# Patient Record
Sex: Female | Born: 1950 | Race: White | Hispanic: No | Smoking: Never smoker
Health system: Southern US, Community
[De-identification: ages and names within clinical notes are randomized; demographics above are authoritative.]

## PROBLEM LIST (undated history)

## (undated) DIAGNOSIS — E079 Disorder of thyroid, unspecified: Secondary | ICD-10-CM

## (undated) DIAGNOSIS — I493 Ventricular premature depolarization: Secondary | ICD-10-CM

## (undated) DIAGNOSIS — T7840XA Allergy, unspecified, initial encounter: Secondary | ICD-10-CM

## (undated) DIAGNOSIS — H269 Unspecified cataract: Secondary | ICD-10-CM

## (undated) DIAGNOSIS — E041 Nontoxic single thyroid nodule: Secondary | ICD-10-CM

## (undated) HISTORY — DX: Allergy, unspecified, initial encounter: T78.40XA

## (undated) HISTORY — DX: Unspecified cataract: H26.9

## (undated) HISTORY — DX: Nontoxic single thyroid nodule: E04.1

## (undated) HISTORY — PX: BREAST EXCISIONAL BIOPSY: SUR124

## (undated) HISTORY — PX: BIOPSY BREAST: PRO8

## (undated) HISTORY — DX: Disorder of thyroid, unspecified: E07.9

## (undated) HISTORY — PX: TONSILLECTOMY AND ADENOIDECTOMY: SHX28

## (undated) HISTORY — PX: CATARACT EXTRACTION: SUR2

## (undated) HISTORY — DX: Ventricular premature depolarization: I49.3

---

## 2020-07-23 ENCOUNTER — Other Ambulatory Visit: Payer: Self-pay

## 2020-07-26 ENCOUNTER — Other Ambulatory Visit: Payer: Self-pay

## 2020-07-26 ENCOUNTER — Encounter: Payer: Self-pay | Admitting: Family Medicine

## 2020-07-26 ENCOUNTER — Ambulatory Visit (INDEPENDENT_AMBULATORY_CARE_PROVIDER_SITE_OTHER): Payer: Medicare Other | Admitting: Family Medicine

## 2020-07-26 VITALS — BP 118/68 | HR 97 | Temp 97.4°F | Ht 65.0 in | Wt 130.4 lb

## 2020-07-26 DIAGNOSIS — Z1231 Encounter for screening mammogram for malignant neoplasm of breast: Secondary | ICD-10-CM

## 2020-07-26 DIAGNOSIS — H2513 Age-related nuclear cataract, bilateral: Secondary | ICD-10-CM

## 2020-07-26 DIAGNOSIS — Z0000 Encounter for general adult medical examination without abnormal findings: Secondary | ICD-10-CM

## 2020-07-26 DIAGNOSIS — Z114 Encounter for screening for human immunodeficiency virus [HIV]: Secondary | ICD-10-CM | POA: Insufficient documentation

## 2020-07-26 DIAGNOSIS — E2839 Other primary ovarian failure: Secondary | ICD-10-CM | POA: Diagnosis not present

## 2020-07-26 DIAGNOSIS — R7309 Other abnormal glucose: Secondary | ICD-10-CM

## 2020-07-26 DIAGNOSIS — M8580 Other specified disorders of bone density and structure, unspecified site: Secondary | ICD-10-CM | POA: Insufficient documentation

## 2020-07-26 NOTE — Patient Instructions (Signed)
Health Maintenance After Age 71 After age 71, you are at a higher risk for certain long-term diseases and infections as well as injuries from falls. Falls are a major cause of broken bones and head injuries in people who are older than age 71. Getting regular preventive care can help to keep you healthy and well. Preventive care includes getting regular testing and making lifestyle changes as recommended by your health care provider. Talk with your health care provider about:  Which screenings and tests you should have. A screening is a test that checks for a disease when you have no symptoms.  A diet and exercise plan that is right for you. What should I know about screenings and tests to prevent falls? Screening and testing are the best ways to find a health problem early. Early diagnosis and treatment give you the best chance of managing medical conditions that are common after age 71. Certain conditions and lifestyle choices may make you more likely to have a fall. Your health care provider may recommend:  Regular vision checks. Poor vision and conditions such as cataracts can make you more likely to have a fall. If you wear glasses, make sure to get your prescription updated if your vision changes.  Medicine review. Work with your health care provider to regularly review all of the medicines you are taking, including over-the-counter medicines. Ask your health care provider about any side effects that may make you more likely to have a fall. Tell your health care provider if any medicines that you take make you feel dizzy or sleepy.  Osteoporosis screening. Osteoporosis is a condition that causes the bones to get weaker. This can make the bones weak and cause them to break more easily.  Blood pressure screening. Blood pressure changes and medicines to control blood pressure can make you feel dizzy.  Strength and balance checks. Your health care provider may recommend certain tests to check your  strength and balance while standing, walking, or changing positions.  Foot health exam. Foot pain and numbness, as well as not wearing proper footwear, can make you more likely to have a fall.  Depression screening. You may be more likely to have a fall if you have a fear of falling, feel emotionally low, or feel unable to do activities that you used to do.  Alcohol use screening. Using too much alcohol can affect your balance and may make you more likely to have a fall. What actions can I take to lower my risk of falls? General instructions  Talk with your health care provider about your risks for falling. Tell your health care provider if: ? You fall. Be sure to tell your health care provider about all falls, even ones that seem minor. ? You feel dizzy, sleepy, or off-balance.  Take over-the-counter and prescription medicines only as told by your health care provider. These include any supplements.  Eat a healthy diet and maintain a healthy weight. A healthy diet includes low-fat dairy products, low-fat (lean) meats, and fiber from whole grains, beans, and lots of fruits and vegetables. Home safety  Remove any tripping hazards, such as rugs, cords, and clutter.  Install safety equipment such as grab bars in bathrooms and safety rails on stairs.  Keep rooms and walkways well-lit. Activity  Follow a regular exercise program to stay fit. This will help you maintain your balance. Ask your health care provider what types of exercise are appropriate for you.  If you need a cane or walker,   use it as recommended by your health care provider.  Wear supportive shoes that have nonskid soles.   Lifestyle  Do not drink alcohol if your health care provider tells you not to drink.  If you drink alcohol, limit how much you have: ? 0-1 drink a day for women. ? 0-2 drinks a day for men.  Be aware of how much alcohol is in your drink. In the [place] one drink equals one typical bottle of beer (12  oz), one-half glass of wine (5 oz), or one shot of hard liquor (1 oz).  Do not use any products that contain nicotine or tobacco, such as cigarettes and e-cigarettes. If you need help quitting, ask your health care provider. Summary  Having a healthy lifestyle and getting preventive care can help to protect your health and wellness after age 74.  Screening and testing are the best way to find a health problem early and help you avoid having a fall. Early diagnosis and treatment give you the best chance for managing medical conditions that are more common for people who are older than age 48.  Falls are a major cause of broken bones and head injuries in people who are older than age 5. Take precautions to prevent a fall at home.  Work with your health care provider to learn what changes you can make to improve your health and wellness and to prevent falls. This information is not intended to replace advice given to you by your health care provider. Make sure you discuss any questions you have with your health care provider. Document Revised: 07/04/2018 Document Reviewed: 01/24/2017 Elsevier Patient Education  [place] 65 Years and Older, Female Preventive care refers to lifestyle choices and visits with your health care provider that can promote health and wellness. This includes:  A yearly physical exam. This is also called an annual wellness visit.  Regular dental and eye exams.  Immunizations.  Screening for certain conditions.  Healthy lifestyle choices, such as: ? Eating a healthy diet. ? Getting regular exercise. ? Not using drugs or products that contain nicotine and tobacco. ? Limiting alcohol use. What can I expect for my preventive care visit? Physical exam Your health care provider will check your:  Height and weight. These may be used to calculate your BMI (body mass index). BMI is a measurement that tells if you are at a healthy  weight.  Heart rate and blood pressure.  Body temperature.  Skin for abnormal spots. Counseling Your health care provider may ask you questions about your:  Past medical problems.  Family's medical history.  Alcohol, tobacco, and drug use.  Emotional well-being.  Home life and relationship well-being.  Sexual activity.  Diet, exercise, and sleep habits.  History of falls.  Memory and ability to understand (cognition).  Work and work Statistician.  Pregnancy and menstrual history.  Access to firearms. What immunizations do I need? Vaccines are usually given at various ages, according to a schedule. Your health care provider will recommend vaccines for you based on your age, medical history, and lifestyle or other factors, such as travel or where you work.   What tests do I need? Blood tests  Lipid and cholesterol levels. These may be checked every 5 years, or more often depending on your overall health.  Hepatitis C test.  Hepatitis B test. Screening  Lung cancer screening. You may have this screening every year starting at age 6 if you have a 30-pack-year  history of smoking and currently smoke or have quit within the past 15 years.  Colorectal cancer screening. ? All adults should have this screening starting at age 26 and continuing until age 74. ? Your health care provider may recommend screening at age 56 if you are at increased risk. ? You will have tests every 1-10 years, depending on your results and the type of screening test.  Diabetes screening. ? This is done by checking your blood sugar (glucose) after you have not eaten for a while (fasting). ? You may have this done every 1-3 years.  Mammogram. ? This may be done every 1-2 years. ? Talk with your health care provider about how often you should have regular mammograms.  Abdominal aortic aneurysm (AAA) screening. You may need this if you are a current or former smoker.  BRCA-related cancer  screening. This may be done if you have a family history of breast, ovarian, tubal, or peritoneal cancers. Other tests  STD (sexually transmitted disease) testing, if you are at risk.  Bone density scan. This is done to screen for osteoporosis. You may have this done starting at age 67. Talk with your health care provider about your test results, treatment options, and if necessary, the need for more tests. Follow these instructions at home: Eating and drinking  Eat a diet that includes fresh fruits and vegetables, whole grains, lean protein, and low-fat dairy products. Limit your intake of foods with high amounts of sugar, saturated fats, and salt.  Take vitamin and mineral supplements as recommended by your health care provider.  Do not drink alcohol if your health care provider tells you not to drink.  If you drink alcohol: ? Limit how much you have to 0-1 drink a day. ? Be aware of how much alcohol is in your drink. In the [place] one drink equals one 12 oz bottle of beer (355 mL), one 5 oz glass of wine (148 mL), or one 1 oz glass of hard liquor (44 mL).   Lifestyle  Take daily care of your teeth and gums. Brush your teeth every morning and night with fluoride toothpaste. Floss one time each day.  Stay active. Exercise for at least 30 minutes 5 or more days each week.  Do not use any products that contain nicotine or tobacco, such as cigarettes, e-cigarettes, and chewing tobacco. If you need help quitting, ask your health care provider.  Do not use drugs.  If you are sexually active, practice safe sex. Use a condom or other form of protection in order to prevent STIs (sexually transmitted infections).  Talk with your health care provider about taking a low-dose aspirin or statin.  Find healthy ways to cope with stress, such as: ? Meditation, yoga, or listening to music. ? Journaling. ? Talking to a trusted person. ? Spending time with friends and family. Safety  Always  wear your seat belt while driving or riding in a vehicle.  Do not drive: ? If you have been drinking alcohol. Do not ride with someone who has been drinking. ? When you are tired or distracted. ? While texting.  Wear a helmet and other protective equipment during sports activities.  If you have firearms in your house, make sure you follow all gun safety procedures. What's next?  Visit your health care provider once a year for an annual wellness visit.  Ask your health care provider how often you should have your eyes and teeth checked.  Stay up to date  on all vaccines. This information is not intended to replace advice given to you by your health care provider. Make sure you discuss any questions you have with your health care provider. Document Revised: 03/03/2020 Document Reviewed: 03/07/2018 Elsevier Patient Education  2021 [employer].

## 2020-07-26 NOTE — Progress Notes (Signed)
New Patient Office Visit  Subjective:  Patient ID: Joanna Rowe, female    DOB: Apr 19, 1950  Age: 70 y.o. MRN: [id number]  CC:  Chief Complaint  Patient presents with  . Establish Care    NP/establish care would like referrals to eye doctor, mammogram and colonoscopy.     HPI Joanna Rowe presents for establishment of care and follow-up for health maintenance.  Moved into this area from [place] back in January to be closer to family.  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  Her late husband died 15 years ago.  She is healthy as far she knows.  She takes no regular medications.  History of osteopenia that has been successfully treated in the past.  Last Pap smear was back in September of this year.  She is due for mammogram, DEXA scan and colonoscopy.  She has cataracts and feels as though they need to be removed.  She has no children.  Her brother-in-law is also a patient of mine.  Past Medical History:  Diagnosis Date  . Allergy   . Cataract     Past Surgical History:  Procedure Laterality Date  . BIOPSY BREAST    . TONSILLECTOMY AND ADENOIDECTOMY      Family History  Problem Relation Age of Onset  . Diabetes Mother   . Heart disease Father     Social History   Socioeconomic History  . Marital status: Widowed    Spouse name: Not on file  . Number of children: Not on file  . Years of education: Not on file  . Highest education level: Not on file  Occupational History  . Not on file  Tobacco Use  . Smoking status: Never Smoker  . Smokeless tobacco: Never Used  Vaping Use  . Vaping Use: Never used  Substance and Sexual Activity  . Alcohol use: Yes    Comment: 1 glass a week   . Drug use: Never  . Sexual activity: Not on file  Other Topics Concern  . Not on file  Social History Narrative  . Not on file   Social Determinants of Health   Financial Resource Strain: Not on file  Food Insecurity: Not on file  Transportation Needs: Not on file   Physical Activity: Not on file  Stress: Not on file  Social Connections: Not on file  Intimate Partner Violence: Not on file    ROS Review of Systems  Constitutional: Negative.   HENT: Positive for hearing loss.   Eyes: Positive for visual disturbance. Negative for photophobia.  Respiratory: Negative.   Cardiovascular: Negative.   Gastrointestinal: Negative.   Genitourinary: Negative.   Musculoskeletal: Negative.   Neurological: Negative.   Psychiatric/Behavioral: Negative.     Objective:   Today's Vitals: BP 118/68   Pulse 97   Temp (!) 97.4 F (36.3 C) (Temporal)   Ht 5\' 5"  (1.651 m)   Wt 130 lb 6.4 oz (59.1 kg)   SpO2 97%   BMI 21.70 kg/m   Physical Exam Vitals and nursing note reviewed.  Constitutional:      General: She is not in acute distress.    Appearance: Normal appearance. She is normal weight. She is not ill-appearing or toxic-appearing.  HENT:     Head: Normocephalic and atraumatic.     Right Ear: Tympanic membrane, ear canal and external ear normal.     Left Ear: Tympanic membrane, ear canal and external ear normal.     Mouth/Throat:  Mouth: Mucous membranes are moist.     Pharynx: Oropharynx is clear.  Eyes:     Conjunctiva/sclera: Conjunctivae normal.     Pupils: Pupils are equal, round, and reactive to light.  Neck:     Vascular: No carotid bruit.  Cardiovascular:     Rate and Rhythm: Normal rate and regular rhythm.  Pulmonary:     Effort: Pulmonary effort is normal.     Breath sounds: Normal breath sounds.  Abdominal:     General: Bowel sounds are normal.  Musculoskeletal:     Cervical back: No rigidity or tenderness.     Right lower leg: No edema.     Left lower leg: No edema.  Lymphadenopathy:     Cervical: No cervical adenopathy.  Skin:    General: Skin is warm and dry.  Neurological:     Mental Status: She is alert and oriented to person, place, and time.  Psychiatric:        Mood and Affect: Mood normal.     Assessment  & Plan:   Problem List Items Addressed This Visit      Musculoskeletal and Integument   Osteopenia     Other   Elevated glucose   Relevant Orders   Hemoglobin A1c   CBC   Nuclear age-related cataract, both eyes   Relevant Orders   Ambulatory referral to Ophthalmology   Healthcare maintenance - Primary   Relevant Orders   Ambulatory referral to Gastroenterology   Lipid panel   Urinalysis, Routine w reflex microscopic   MM Digital Screening   Comprehensive metabolic panel    Other Visit Diagnoses    Estrogen deficiency       Relevant Orders   DG Bone Density   Encounter for screening mammogram for malignant neoplasm of breast        Relevant Orders   MM Digital Screening      Outpatient Encounter Medications as of 07/26/2020  Medication Sig  . calcium citrate-vitamin D (CITRACAL+D) 315-200 MG-UNIT tablet Take 1 tablet by mouth 2 (two) times daily.  . cholecalciferol (VITAMIN D3) 25 MCG (1000 UNIT) tablet Take 1,000 Units by mouth daily.  . Multiple Vitamin (MULTIVITAMIN) capsule Take 1 capsule by mouth daily.   No facility-administered encounter medications on file as of 07/26/2020.    Follow-up: Return in about 1 year (around 07/26/2021).   Joanna Sax, MD

## 2020-08-02 ENCOUNTER — Ambulatory Visit (HOSPITAL_BASED_OUTPATIENT_CLINIC_OR_DEPARTMENT_OTHER)
Admission: RE | Admit: 2020-08-02 | Discharge: 2020-08-02 | Disposition: A | Discharge status: Home / Self Care | Payer: Medicare Other | Source: Ambulatory Visit | Attending: Family Medicine | Admitting: Family Medicine

## 2020-08-02 ENCOUNTER — Other Ambulatory Visit: Payer: Self-pay

## 2020-08-02 ENCOUNTER — Encounter (HOSPITAL_BASED_OUTPATIENT_CLINIC_OR_DEPARTMENT_OTHER): Payer: Self-pay

## 2020-08-02 ENCOUNTER — Other Ambulatory Visit: Payer: Self-pay | Admitting: Family Medicine

## 2020-08-02 DIAGNOSIS — E2839 Other primary ovarian failure: Secondary | ICD-10-CM | POA: Diagnosis present

## 2020-08-02 DIAGNOSIS — Z0000 Encounter for general adult medical examination without abnormal findings: Secondary | ICD-10-CM

## 2020-08-02 DIAGNOSIS — Z1231 Encounter for screening mammogram for malignant neoplasm of breast: Secondary | ICD-10-CM

## 2020-08-03 ENCOUNTER — Encounter: Payer: Self-pay | Admitting: Family Medicine

## 2020-08-04 ENCOUNTER — Encounter: Payer: Self-pay | Admitting: Family Medicine

## 2020-08-06 ENCOUNTER — Encounter: Payer: Self-pay | Admitting: Family Medicine

## 2020-08-06 ENCOUNTER — Other Ambulatory Visit: Payer: Self-pay

## 2020-08-06 ENCOUNTER — Telehealth: Payer: Self-pay | Admitting: Family Medicine

## 2020-08-06 ENCOUNTER — Other Ambulatory Visit (INDEPENDENT_AMBULATORY_CARE_PROVIDER_SITE_OTHER): Payer: Medicare Other

## 2020-08-06 DIAGNOSIS — Z0000 Encounter for general adult medical examination without abnormal findings: Secondary | ICD-10-CM | POA: Diagnosis not present

## 2020-08-06 DIAGNOSIS — R7309 Other abnormal glucose: Secondary | ICD-10-CM | POA: Diagnosis not present

## 2020-08-06 LAB — COMPREHENSIVE METABOLIC PANEL
ALT: 21 U/L (ref 0–35)
AST: 22 U/L (ref 0–37)
Albumin: 4.3 g/dL (ref 3.5–5.2)
Alkaline Phosphatase: 53 U/L (ref 39–117)
BUN: 23 mg/dL (ref 6–23)
CO2: 29 mEq/L (ref 19–32)
Calcium: 9.3 mg/dL (ref 8.4–10.5)
Chloride: 105 mEq/L (ref 96–112)
Creatinine, Ser: 0.71 mg/dL (ref 0.40–1.20)
GFR: 85.91 mL/min (ref >60.00)
Glucose, Bld: 99 mg/dL (ref 70–99)
Potassium: 4 mEq/L (ref 3.5–5.1)
Sodium: 141 mEq/L (ref 135–145)
Total Bilirubin: 0.5 mg/dL (ref 0.2–1.2)
Total Protein: 6.3 g/dL (ref 6.0–8.3)

## 2020-08-06 LAB — CBC
HCT: 40.8 % (ref 36.0–46.0)
Hemoglobin: 13.8 g/dL (ref 12.0–15.0)
MCHC: 33.9 g/dL (ref 30.0–36.0)
MCV: 95.1 fl (ref 78.0–100.0)
Platelets: 171 10*3/uL (ref 150.0–400.0)
RBC: 4.29 Mil/uL (ref 3.87–5.11)
RDW: 13.6 % (ref 11.5–15.5)
WBC: 3.7 10*3/uL — ABNORMAL LOW (ref 4.0–10.5)

## 2020-08-06 LAB — HEMOGLOBIN A1C: Hgb A1c MFr Bld: 5.8 % (ref 4.6–6.5)

## 2020-08-06 NOTE — Progress Notes (Signed)
Per orders of Dr. Kremer pt is here for labs pt tolerated draw well.  

## 2020-08-06 NOTE — Telephone Encounter (Signed)
Patient is calling regarding her Mammogram results. Please call her back at [phone] once they have been reviewed.

## 2020-08-10 ENCOUNTER — Encounter: Payer: Self-pay | Admitting: Family Medicine

## 2020-08-10 ENCOUNTER — Telehealth (INDEPENDENT_AMBULATORY_CARE_PROVIDER_SITE_OTHER): Payer: Medicare Other | Admitting: Family Medicine

## 2020-08-10 VITALS — Ht 65.0 in

## 2020-08-10 DIAGNOSIS — Z0000 Encounter for general adult medical examination without abnormal findings: Secondary | ICD-10-CM

## 2020-08-10 DIAGNOSIS — D709 Neutropenia, unspecified: Secondary | ICD-10-CM | POA: Diagnosis not present

## 2020-08-10 DIAGNOSIS — M816 Localized osteoporosis [Lequesne]: Secondary | ICD-10-CM | POA: Diagnosis not present

## 2020-08-10 DIAGNOSIS — M8080XA Other osteoporosis with current pathological fracture, unspecified site, initial encounter for fracture: Secondary | ICD-10-CM | POA: Insufficient documentation

## 2020-08-10 DIAGNOSIS — Z114 Encounter for screening for human immunodeficiency virus [HIV]: Secondary | ICD-10-CM

## 2020-08-10 NOTE — Progress Notes (Addendum)
Established Patient Office Visit  Subjective:  Patient ID: Joanna Rowe, female    DOB: 01-20-1951  Age: 70 y.o. MRN: [id number]  CC:  Chief Complaint  Patient presents with   Advice Only    Discuss bone density. Concerns about WBC on labs not sure if this would affect upcoming eye surgery.     HPI Joanna Rowe presents for follow-up of her DEXA scan and concern about neutropenia.  White cell count was 3.7.  Patient was osteoporotic in her left forearm.  Measurements taken in the femur neck and lumbar spine were normal.  She took Actonel for at least 5 years.  She continues taking 5000 units of vitamin D and over 1200 mg of calcium daily.  She has no risk factors for HIV.  Husband passed 15 years ago and she has had no exposures.  Past Medical History:  Diagnosis Date   Allergy    Cataract     Past Surgical History:  Procedure Laterality Date   BIOPSY BREAST     BREAST EXCISIONAL BIOPSY Left    TONSILLECTOMY AND ADENOIDECTOMY      Family History  Problem Relation Age of Onset   Diabetes Mother    Heart disease Father    Breast cancer Maternal Aunt     Social History   Socioeconomic History   Marital status: Widowed    Spouse name: Not on file   Number of children: Not on file   Years of education: Not on file   Highest education level: Not on file  Occupational History   Not on file  Tobacco Use   Smoking status: Never Smoker   Smokeless tobacco: Never Used  Vaping Use   Vaping Use: Never used  Substance and Sexual Activity   Alcohol use: Yes    Comment: 1 glass a week    Drug use: Never   Sexual activity: Not on file  Other Topics Concern   Not on file  Social History Narrative   Not on file   Social Determinants of Health   Financial Resource Strain: Not on file  Food Insecurity: Not on file  Transportation Needs: Not on file  Physical Activity: Not on file  Stress: Not on file  Social Connections: Not on file  Intimate Partner Violence:  Not on file    Outpatient Medications Prior to Visit  Medication Sig Dispense Refill   calcium citrate-vitamin D (CITRACAL+D) 315-200 MG-UNIT tablet Take 1 tablet by mouth 2 (two) times daily.     cholecalciferol (VITAMIN D3) 25 MCG (1000 UNIT) tablet Take 1,000 Units by mouth daily.     Multiple Vitamin (MULTIVITAMIN) capsule Take 1 capsule by mouth daily.     No facility-administered medications prior to visit.    Not on File  ROS Review of Systems  Constitutional: Negative.   Respiratory: Negative.   Cardiovascular: Negative.   Gastrointestinal: Negative.       Objective:    Physical Exam Vitals and nursing note reviewed.  Constitutional:      Appearance: Normal appearance. She is normal weight.  Eyes:     Pupils: Pupils are equal, round, and reactive to light.  Pulmonary:     Effort: Pulmonary effort is normal.  Neurological:     Mental Status: She is alert and oriented to person, place, and time.  Psychiatric:        Mood and Affect: Mood normal.        Behavior: Behavior normal.  Ht 5\' 5"  (1.651 m)   BMI 21.70 kg/m  Wt Readings from Last 3 Encounters:  07/26/20 130 lb 6.4 oz (59.1 kg)     Health Maintenance Due  Topic Date Due   Hepatitis C Screening  Never done   TETANUS/TDAP  Never done   COLONOSCOPY (Pts 45-46yrs Insurance coverage will need to be confirmed)  Never done   PNA vac Low Risk Adult (1 of 2 - PCV13) Never done    There are no preventive care reminders to display for this patient.  No results found for: TSH Lab Results  Component Value Date   WBC 3.7 (L) 08/06/2020   HGB 13.8 08/06/2020   HCT 40.8 08/06/2020   MCV 95.1 08/06/2020   PLT 171.0 08/06/2020   Lab Results  Component Value Date   NA 141 08/06/2020   K 4.0 08/06/2020   CO2 29 08/06/2020   GLUCOSE 99 08/06/2020   BUN 23 08/06/2020   CREATININE 0.71 08/06/2020   BILITOT 0.5 08/06/2020   ALKPHOS 53 08/06/2020   AST 22 08/06/2020   ALT 21 08/06/2020   PROT 6.3  08/06/2020   ALBUMIN 4.3 08/06/2020   CALCIUM 9.3 08/06/2020   GFR 85.91 08/06/2020   No results found for: CHOL No results found for: HDL No results found for: LDLCALC No results found for: TRIG No results found for: CHOLHDL Lab Results  Component Value Date   HGBA1C 5.8 08/06/2020      Assessment & Plan:   Problem List Items Addressed This Visit       Musculoskeletal and Integument   Localized osteoporosis without current pathological fracture     Other   Encounter for special screening examination for HIV   Neutropenia (HCC) - Primary   Relevant Orders   CBC w/Diff       No orders of the defined types were placed in this encounter.   Follow-up: No follow-ups on file.  Patient will continue with calcium and vitamin D.  Recheck DEXA scan in 2 years.  Apparently her lipid panel and urinalysis was not drawn.  She will return fasting for those tests as well as a CBC with differential.  Advised that we will follow her white blood cell count over the next several months.  08/08/2020, MD  Virtual Visit via Video Note  I connected with Joanna Rowe on 09/06/20 at  4:00 PM EDT by a video enabled telemedicine application and verified that I am speaking with the correct person using two identifiers.  Location: Patient: home alone Provider: clinic   I discussed the limitations of evaluation and management by telemedicine and the availability of in person appointments. The patient expressed understanding and agreed to proceed.  History of Present Illness:    Observations/Objective:   Assessment and Plan:   Follow Up Instructions:    I discussed the assessment and treatment plan with the patient. The patient was provided an opportunity to ask questions and all were answered. The patient agreed with the plan and demonstrated an understanding of the instructions.   The patient was advised to call back or seek an in-person evaluation if the symptoms  worsen or if the condition fails to improve as anticipated.  I provided 25 minutes of non-face-to-face time during this encounter.   09/08/20, MD

## 2020-08-10 NOTE — Telephone Encounter (Signed)
Appointment scheduled to discuss below

## 2020-08-17 ENCOUNTER — Encounter: Payer: Self-pay | Admitting: Family Medicine

## 2020-08-17 DIAGNOSIS — E041 Nontoxic single thyroid nodule: Secondary | ICD-10-CM

## 2020-08-18 DIAGNOSIS — E041 Nontoxic single thyroid nodule: Secondary | ICD-10-CM | POA: Insufficient documentation

## 2020-08-18 NOTE — Addendum Note (Signed)
Addended by: Andrez Grime on: 08/18/2020 08:09 AM   Modules accepted: Orders

## 2020-08-20 ENCOUNTER — Other Ambulatory Visit: Payer: Self-pay

## 2020-08-20 ENCOUNTER — Other Ambulatory Visit (INDEPENDENT_AMBULATORY_CARE_PROVIDER_SITE_OTHER): Payer: Medicare Other

## 2020-08-20 DIAGNOSIS — D709 Neutropenia, unspecified: Secondary | ICD-10-CM

## 2020-08-20 DIAGNOSIS — Z0000 Encounter for general adult medical examination without abnormal findings: Secondary | ICD-10-CM

## 2020-08-20 DIAGNOSIS — E041 Nontoxic single thyroid nodule: Secondary | ICD-10-CM | POA: Diagnosis not present

## 2020-08-20 LAB — CBC WITH DIFFERENTIAL/PLATELET
Basophils Absolute: 0 10*3/uL (ref 0.0–0.1)
Basophils Relative: 0.8 % (ref 0.0–3.0)
Eosinophils Absolute: 0.1 10*3/uL (ref 0.0–0.7)
Eosinophils Relative: 1.4 % (ref 0.0–5.0)
HCT: 39.5 % (ref 36.0–46.0)
Hemoglobin: 13.4 g/dL (ref 12.0–15.0)
Lymphocytes Relative: 22.1 % (ref 12.0–46.0)
Lymphs Abs: 0.9 10*3/uL (ref 0.7–4.0)
MCHC: 34.1 g/dL (ref 30.0–36.0)
MCV: 95.8 fl (ref 78.0–100.0)
Monocytes Absolute: 0.4 10*3/uL (ref 0.1–1.0)
Monocytes Relative: 9.2 % (ref 3.0–12.0)
Neutro Abs: 2.7 10*3/uL (ref 1.4–7.7)
Neutrophils Relative %: 66.5 % (ref 43.0–77.0)
Platelets: 170 10*3/uL (ref 150.0–400.0)
RBC: 4.12 Mil/uL (ref 3.87–5.11)
RDW: 13.6 % (ref 11.5–15.5)
WBC: 4 10*3/uL (ref 4.0–10.5)

## 2020-08-20 LAB — URINALYSIS, ROUTINE W REFLEX MICROSCOPIC
Bilirubin Urine: NEGATIVE
Hgb urine dipstick: NEGATIVE
Ketones, ur: NEGATIVE
Leukocytes,Ua: NEGATIVE
Nitrite: NEGATIVE
RBC / HPF: NONE SEEN (ref 0–?)
Specific Gravity, Urine: 1.01 (ref 1.000–1.030)
Total Protein, Urine: NEGATIVE
Urine Glucose: NEGATIVE
Urobilinogen, UA: 0.2 (ref 0.0–1.0)
pH: 6 (ref 5.0–8.0)

## 2020-08-20 LAB — LIPID PANEL
Cholesterol: 218 mg/dL — ABNORMAL HIGH (ref 0–200)
HDL: 97.3 mg/dL (ref >39.00)
LDL Cholesterol: 109 mg/dL — ABNORMAL HIGH (ref 0–99)
NonHDL: 121.02
Total CHOL/HDL Ratio: 2
Triglycerides: 58 mg/dL (ref 0.0–149.0)
VLDL: 11.6 mg/dL (ref 0.0–40.0)

## 2020-08-20 LAB — TSH: TSH: 3.68 u[IU]/mL (ref 0.35–4.50)

## 2020-08-20 NOTE — Progress Notes (Signed)
Per orders of Dr. Kremer pt is here for labs tolerated draw well.  

## 2020-08-24 ENCOUNTER — Other Ambulatory Visit: Payer: Self-pay

## 2020-08-24 ENCOUNTER — Ambulatory Visit (INDEPENDENT_AMBULATORY_CARE_PROVIDER_SITE_OTHER): Payer: Medicare Other | Admitting: Family Medicine

## 2020-08-24 ENCOUNTER — Encounter: Payer: Self-pay | Admitting: Family Medicine

## 2020-08-24 VITALS — BP 128/70 | HR 78 | Temp 97.5°F | Ht 65.0 in | Wt 131.2 lb

## 2020-08-24 DIAGNOSIS — R221 Localized swelling, mass and lump, neck: Secondary | ICD-10-CM | POA: Diagnosis not present

## 2020-08-24 DIAGNOSIS — E079 Disorder of thyroid, unspecified: Secondary | ICD-10-CM | POA: Diagnosis not present

## 2020-08-24 NOTE — Progress Notes (Addendum)
Established Patient Office Visit  Subjective:  Patient ID: Joanna Rowe, female    DOB: 07-15-1950  Age: 70 y.o. MRN: [id number]  CC:  Chief Complaint  Patient presents with  . Mass    Patient noticed lump on neck x 10 days ago. No pain     HPI Joanna Rowe presents for evaluation of 2 masses in her neck area.  There is a mass on the lower right side of her neck that she just noticed.  There is a mass on the upper left side of her neck that has been present for some time.  It was noted back in [place].  Recent TSH was normal.  Recent CBC with differential was normal.  Past Medical History:  Diagnosis Date  . Allergy   . Cataract     Past Surgical History:  Procedure Laterality Date  . BIOPSY BREAST    . BREAST EXCISIONAL BIOPSY Left   . TONSILLECTOMY AND ADENOIDECTOMY      Family History  Problem Relation Age of Onset  . Diabetes Mother   . Heart disease Father   . Breast cancer Maternal Aunt     Social History   Socioeconomic History  . Marital status: Widowed    Spouse name: Not on file  . Number of children: Not on file  . Years of education: Not on file  . Highest education level: Not on file  Occupational History  . Not on file  Tobacco Use  . Smoking status: Never Smoker  . Smokeless tobacco: Never Used  Vaping Use  . Vaping Use: Never used  Substance and Sexual Activity  . Alcohol use: Yes    Comment: 1 glass a week   . Drug use: Never  . Sexual activity: Not on file  Other Topics Concern  . Not on file  Social History Narrative  . Not on file   Social Determinants of Health   Financial Resource Strain: Not on file  Food Insecurity: Not on file  Transportation Needs: Not on file  Physical Activity: Not on file  Stress: Not on file  Social Connections: Not on file  Intimate Partner Violence: Not on file    Outpatient Medications Prior to Visit  Medication Sig Dispense Refill  . calcium citrate-vitamin D (CITRACAL+D) 315-200  MG-UNIT tablet Take 1 tablet by mouth 2 (two) times daily.    . cholecalciferol (VITAMIN D3) 25 MCG (1000 UNIT) tablet Take 1,000 Units by mouth daily.    . Multiple Vitamin (MULTIVITAMIN) capsule Take 1 capsule by mouth daily.     No facility-administered medications prior to visit.    Not on File  ROS Review of Systems  Constitutional: Negative.   Respiratory: Negative.   Cardiovascular: Negative.   Gastrointestinal: Negative.   Endocrine: Negative for cold intolerance, heat intolerance, polyphagia and polyuria.  Psychiatric/Behavioral: Negative.       Objective:    Physical Exam Vitals and nursing note reviewed.  Constitutional:      Appearance: Normal appearance.  Neck:     Thyroid: Thyroid mass present. No thyroid tenderness.   Pulmonary:     Effort: Pulmonary effort is normal.  Lymphadenopathy:     Cervical: Cervical adenopathy present.     Left cervical: Superficial cervical adenopathy present.  Neurological:     Mental Status: She is alert and oriented to person, place, and time.  Psychiatric:        Mood and Affect: Mood normal.  Behavior: Behavior normal.     BP 128/70   Pulse 78   Temp (!) 97.5 F (36.4 C) (Temporal)   Ht 5\' 5"  (1.651 m)   Wt 131 lb 3.2 oz (59.5 kg)   SpO2 97%   BMI 21.83 kg/m  Wt Readings from Last 3 Encounters:  08/24/20 131 lb 3.2 oz (59.5 kg)  07/26/20 130 lb 6.4 oz (59.1 kg)     Health Maintenance Due  Topic Date Due  . Hepatitis C Screening  Never done  . TETANUS/TDAP  Never done  . COLONOSCOPY (Pts 45-68yrs Insurance coverage will need to be confirmed)  Never done  . Zoster Vaccines- Shingrix (1 of 2) Never done  . PNA vac Low Risk Adult (1 of 2 - PCV13) Never done    There are no preventive care reminders to display for this patient.  Lab Results  Component Value Date   TSH 3.68 08/20/2020   Lab Results  Component Value Date   WBC 4.0 08/20/2020   HGB 13.4 08/20/2020   HCT 39.5 08/20/2020   MCV 95.8  08/20/2020   PLT 170.0 08/20/2020   Lab Results  Component Value Date   NA 141 08/06/2020   K 4.0 08/06/2020   CO2 29 08/06/2020   GLUCOSE 99 08/06/2020   BUN 23 08/06/2020   CREATININE 0.71 08/06/2020   BILITOT 0.5 08/06/2020   ALKPHOS 53 08/06/2020   AST 22 08/06/2020   ALT 21 08/06/2020   PROT 6.3 08/06/2020   ALBUMIN 4.3 08/06/2020   CALCIUM 9.3 08/06/2020   GFR 85.91 08/06/2020   Lab Results  Component Value Date   CHOL 218 (H) 08/20/2020   Lab Results  Component Value Date   HDL 97.30 08/20/2020   Lab Results  Component Value Date   LDLCALC 109 (H) 08/20/2020   Lab Results  Component Value Date   TRIG 58.0 08/20/2020   Lab Results  Component Value Date   CHOLHDL 2 08/20/2020   Lab Results  Component Value Date   HGBA1C 5.8 08/06/2020      Assessment & Plan:   Problem List Items Addressed This Visit      Other   Thyroid mass - Primary   Relevant Orders   08/08/2020 THYROID (Completed)   [place] [facility] BX THYROID 1ST LESION AFIRMA      No orders of the defined types were placed in this encounter.   Follow-up: Return in about 3 months (around 11/24/2020).  There is a second diagnosis here that the software is not allowing me to register.  There is a mass in the left upper anterior neck area that appears to be part of the left anterior cervical chain.  I have ordered a neck ultrasound for that mass.  There is an ultrasound for the thyroid ordered for the thyroid mass noted today as well.  If they can combine the 2 exams into 1 that would be fine with me.  11/26/2020, MD

## 2020-08-24 NOTE — Patient Instructions (Signed)
Thyroid Nodule  A thyroid nodule is an isolated growth of thyroid cells that forms a lump in your thyroid gland. The thyroid gland is a butterfly-shaped gland. It is found in the lower front of your neck. This gland sends chemical messengers (hormones) through your blood to all parts of your body. These hormones are important in regulating your body temperature and helping your body to use energy. Thyroid nodules are common. Most are not cancerous (benign). You may have one nodule or several nodules. Different types of thyroid nodules include nodules that:  Grow and fill with fluid (thyroid cysts).  Produce too much thyroid hormone (hot nodules or hyperthyroid).  Produce no thyroid hormone (cold nodules or hypothyroid).  Form from cancer cells (thyroid cancers). What are the causes? In most cases, the cause of this condition is not known. What increases the risk? The following factors may make you more likely to develop this condition.  Age. Thyroid nodules become more common in people who are older than 71 years of age.  Gender. ? Benign thyroid nodules are more common in women. ? Cancerous (malignant) thyroid nodules are more common in men.  A family history that includes: ? Thyroid nodules. ? Pheochromocytoma. ? Thyroid carcinoma. ? Hyperparathyroidism.  Certain kinds of thyroid diseases, such as Hashimoto's thyroiditis.  Lack of iodine in your diet.  A history of head and neck radiation, such as from previous cancer treatment. What are the signs or symptoms? In many cases, there are no symptoms. If you have symptoms, they may include:  A lump in your lower neck.  Feeling a lump or tickle in your throat.  Pain in your neck, jaw, or ear.  Having trouble swallowing. Hot nodules may cause symptoms that include:  Weight loss.  Warm, flushed skin.  Feeling hot.  Feeling nervous.  A racing heartbeat. Cold nodules may cause symptoms that include:  Weight  gain.  Dry skin.  Brittle hair. This may also occur with hair loss.  Feeling cold.  Fatigue. Thyroid cancer nodules may cause symptoms that include:  Hard nodules that feel stuck to the thyroid gland.  Hoarseness.  Lumps in the glands near your thyroid (lymph nodes). How is this diagnosed? A thyroid nodule may be felt by your health care provider during a physical exam. This condition may also be diagnosed based on your symptoms. You may also have tests, including:  An ultrasound. This may be done to confirm the diagnosis.  A biopsy. This involves taking a sample from the nodule and looking at it under a microscope.  Blood tests to make sure that your thyroid is working properly.  A thyroid scan. This test uses a radioactive tracer injected into a vein to create an image of the thyroid gland on a computer screen.  Imaging tests such as MRI or CT scan. These may be done if: ? Your nodule is large. ? Your nodule is blocking your airway. ? Cancer is suspected. How is this treated? Treatment depends on the cause and size of your nodule or nodules. If the nodule is benign, treatment may not be necessary. Your health care provider may monitor the nodule to see if it goes away without treatment. If the nodule continues to grow, is cancerous, or does not go away, treatment may be needed. Treatment may include:  Having a cystic nodule drained with a needle.  Ablation therapy. In this treatment, alcohol is injected into the area of the nodule to destroy the cells. Ablation with heat (  thermal ablation) may also be used.  Radioactive iodine. In this treatment, radioactive iodine is given as a pill or liquid that you drink. This substance causes the thyroid nodule to shrink.  Surgery to remove the nodule. Part or all of your thyroid gland may need to be removed as well.  Medicines. Follow these instructions at home:  Pay attention to any changes in your nodule.  Take  over-the-counter and prescription medicines only as told by your health care provider.  Keep all follow-up visits as told by your health care provider. This is important. Contact a health care provider if:  Your voice changes.  You have trouble swallowing.  You have pain in your neck, ear, or jaw that is getting worse.  Your nodule gets bigger.  Your nodule starts to make it harder for you to breathe.  Your muscles look like they are shrinking (muscle wasting). Get help right away if:  You have chest pain.  There is a loss of consciousness.  You have a sudden fever.  You feel confused.  You are seeing or hearing things that other people do not see or hear (having hallucinations).  You feel very weak.  You have mood swings.  You feel very restless.  You feel suddenly nauseous or throw up.  You suddenly have diarrhea. Summary  A thyroid nodule is an isolated growth of thyroid cells that forms a lump in your thyroid gland.  Thyroid nodules are common. Most are not cancerous (benign). You may have one nodule or several nodules.  Treatment depends on the cause and size of your nodule or nodules. If the nodule is benign, treatment may not be necessary.  Your health care provider may monitor the nodule to see if it goes away without treatment. If the nodule continues to grow, is cancerous, or does not go away, treatment may be needed. This information is not intended to replace advice given to you by your health care provider. Make sure you discuss any questions you have with your health care provider. Document Revised: 10/26/2017 Document Reviewed: 10/29/2017 Elsevier Patient Education  2021 Elsevier Inc.  

## 2020-08-31 ENCOUNTER — Ambulatory Visit (HOSPITAL_BASED_OUTPATIENT_CLINIC_OR_DEPARTMENT_OTHER)
Admission: RE | Admit: 2020-08-31 | Discharge: 2020-08-31 | Disposition: A | Discharge status: Home / Self Care | Payer: Medicare Other | Source: Ambulatory Visit | Attending: Family Medicine | Admitting: Family Medicine

## 2020-08-31 ENCOUNTER — Other Ambulatory Visit: Payer: Self-pay

## 2020-08-31 ENCOUNTER — Encounter: Payer: Self-pay | Admitting: Family Medicine

## 2020-08-31 DIAGNOSIS — E079 Disorder of thyroid, unspecified: Secondary | ICD-10-CM | POA: Diagnosis present

## 2020-08-31 DIAGNOSIS — R221 Localized swelling, mass and lump, neck: Secondary | ICD-10-CM | POA: Insufficient documentation

## 2020-09-01 NOTE — Addendum Note (Signed)
Addended by: Andrez Grime on: 09/01/2020 12:27 PM   Modules accepted: Orders

## 2020-09-09 ENCOUNTER — Ambulatory Visit
Admission: RE | Admit: 2020-09-09 | Discharge: 2020-09-09 | Disposition: A | Discharge status: Home / Self Care | Payer: Medicare Other | Source: Ambulatory Visit | Attending: Family Medicine | Admitting: Family Medicine

## 2020-09-09 DIAGNOSIS — E079 Disorder of thyroid, unspecified: Secondary | ICD-10-CM

## 2020-09-16 ENCOUNTER — Ambulatory Visit
Admission: RE | Admit: 2020-09-16 | Discharge: 2020-09-16 | Disposition: A | Discharge status: Home / Self Care | Payer: Medicare Other | Source: Ambulatory Visit | Attending: Family Medicine | Admitting: Family Medicine

## 2020-09-16 ENCOUNTER — Other Ambulatory Visit (HOSPITAL_COMMUNITY)
Admission: RE | Admit: 2020-09-16 | Discharge: 2020-09-16 | Disposition: A | Discharge status: Home / Self Care | Payer: Medicare Other | Source: Ambulatory Visit | Attending: Family Medicine | Admitting: Family Medicine

## 2020-09-16 DIAGNOSIS — E079 Disorder of thyroid, unspecified: Secondary | ICD-10-CM | POA: Insufficient documentation

## 2020-09-17 LAB — CYTOLOGY - NON PAP

## 2020-10-12 ENCOUNTER — Telehealth: Payer: Self-pay | Admitting: Gastroenterology

## 2020-10-12 NOTE — Telephone Encounter (Signed)
Good afternoon Dr. Barron Alvine, we received a referral for patient to have a colonoscopy.  Her last colon was 11/18/10.  Will fax records to [facility].  Can you please review and advise on scheduling?  Thank you.

## 2020-11-17 NOTE — Telephone Encounter (Signed)
Just put in your box for review

## 2020-11-18 ENCOUNTER — Encounter: Payer: Self-pay | Admitting: Gastroenterology

## 2020-11-18 NOTE — Telephone Encounter (Signed)
LVM for patient to get set up for Previsit and colonoscopy as soon as possible.

## 2020-11-18 NOTE — Telephone Encounter (Signed)
Records received and reviewed and notable for the following:  - Colonoscopy (10/13/2005, Dr. Elam [place] at [facility]): Tortuous colon and unable to get into the tip of the cecum.  Prominent fold vs flat polyp in ascending colon (path: Focal lymphoid hyperplasia without adenomatous change).  Otherwise normal. - Colonoscopy (11/18/2010, Dr. Elam [place] at [facility]): Patulous colon.  1 diverticulum seen and scar in the sigmoid from prior polypectomy.  Otherwise normal.  Based on these prior colonoscopy reports, okay to schedule for direct access colonoscopy with me for ongoing colon cancer screening.  Thank you.

## 2020-11-18 NOTE — Telephone Encounter (Signed)
Patient returned call [facility] 9/14 [facility] 9/28

## 2020-11-21 ENCOUNTER — Encounter: Payer: Self-pay | Admitting: Family Medicine

## 2020-12-04 ENCOUNTER — Telehealth: Payer: Self-pay

## 2020-12-04 NOTE — Telephone Encounter (Signed)
Pt declined to schedule AWV on today, stating she is unsure if Medicare will cover visit since she was just seen in May of this year. Pt states she will call Monday to schedule visit.

## 2020-12-15 ENCOUNTER — Other Ambulatory Visit: Payer: Self-pay

## 2020-12-15 ENCOUNTER — Ambulatory Visit (AMBULATORY_SURGERY_CENTER): Payer: Medicare Other | Admitting: *Deleted

## 2020-12-15 VITALS — Ht 65.0 in | Wt 130.0 lb

## 2020-12-15 DIAGNOSIS — Z86010 Personal history of colonic polyps: Secondary | ICD-10-CM

## 2020-12-15 NOTE — Progress Notes (Signed)
Pre-visit completed over telephone.   Instructions forwarded through MyChart and email, [email or web address]   No egg or soy allergy known to patient  No issues known to pt with past sedation with any surgeries or procedures Patient denies ever being told they had issues or difficulty with intubation  No FH of Malignant Hyperthermia Pt is not on diet pills Pt is not on  home 02  Pt is not on blood thinners  Pt denies issues with constipation  No A fib or A flutter  EMMI video to pt or via MyChart  COVID 19 guidelines implemented in [facility] today with Pt and RN   Pt is fully vaccinated  for Covid   Due to the COVID-19 pandemic we are asking patients to follow certain guidelines.  Pt aware of COVID protocols and [facility] guidelines

## 2020-12-22 ENCOUNTER — Encounter: Payer: Medicare Other | Admitting: Gastroenterology

## 2020-12-29 ENCOUNTER — Ambulatory Visit (AMBULATORY_SURGERY_CENTER): Payer: Medicare Other | Admitting: Gastroenterology

## 2020-12-29 ENCOUNTER — Other Ambulatory Visit: Payer: Self-pay

## 2020-12-29 ENCOUNTER — Encounter: Payer: Self-pay | Admitting: Gastroenterology

## 2020-12-29 VITALS — BP 139/66 | HR 58 | Temp 96.6°F | Resp 12 | Ht 65.0 in | Wt 135.0 lb

## 2020-12-29 DIAGNOSIS — Z86010 Personal history of colonic polyps: Secondary | ICD-10-CM | POA: Diagnosis not present

## 2020-12-29 DIAGNOSIS — K640 First degree hemorrhoids: Secondary | ICD-10-CM

## 2020-12-29 MED ORDER — SODIUM CHLORIDE 0.9 % IV SOLN: 500.0000 mL | Freq: Once | INTRAVENOUS | Status: DC

## 2020-12-29 NOTE — Op Note (Signed)
[facility] Patient Name: Joanna Rowe Procedure Date: 12/29/2020 11:22 AM MRN: [id number] Endoscopist: Doristine Locks , MD Age: 70 Referring MD:  Date of Birth: 1950-11-15 Gender: Female Account #: [id number] Procedure:                Colonoscopy Indications:              Screening for colorectal malignant neoplasm (last                            colonoscopy was 10 years ago)                           ?" Colonoscopy (10/13/2005, Dr. Bonney Leitz at                            [facility]): Tortuous colon and unable to                            get into the tip of the cecum. Prominent foldvs                            flat polyp in ascending colon (path: Focal lymphoid                            hyperplasia without adenomatous change). Otherwise                            normal.                           ?" Colonoscopy (11/18/2010, Dr. Bonney Leitz at                            [facility]): Patulous colon. 1                            diverticulum seen and scar in the sigmoid from                            prior polypectomy. Otherwise normal. Medicines:                Monitored Anesthesia Care Procedure:                Pre-Anesthesia Assessment:                           - Prior to the procedure, a History and Physical                            was performed, and patient medications and                            allergies were reviewed. The patient's tolerance of                            previous anesthesia was  also reviewed. The risks                            and benefits of the procedure and the sedation                            options and risks were discussed with the patient.                            All questions were answered, and informed consent                            was obtained. Prior Anticoagulants: The patient has                            taken no previous anticoagulant or antiplatelet                             agents. ASA Grade Assessment: II - A patient with                            mild systemic disease. After reviewing the risks                            and benefits, the patient was deemed in                            satisfactory condition to undergo the procedure.                           After obtaining informed consent, the colonoscope                            was passed under direct vision. Throughout the                            procedure, the patient's blood pressure, pulse, and                            oxygen saturations were monitored continuously. The                            CF [id number] #[id number] was introduced through the anus                            and advanced to the the cecum, identified by                            appendiceal orifice and ileocecal valve. The                            colonoscopy was performed without difficulty. The  patient tolerated the procedure well. The quality                            of the bowel preparation was good. The ileocecal                            valve, appendiceal orifice, and rectum were                            photographed. Scope In: 11:37:05 AM Scope Out: 11:51:01 AM Scope Withdrawal Time: 0 hours 10 minutes 6 seconds  Total Procedure Duration: 0 hours 13 minutes 56 seconds  Findings:                 The perianal and digital rectal examinations were                            normal.                           The colon appeared normal throughout.                           Non-bleeding internal hemorrhoids were found during                            retroflexion. The hemorrhoids were small and Grade                            I (internal hemorrhoids that do not prolapse). Complications:            No immediate complications. Estimated Blood Loss:     Estimated blood loss: none. Impression:               - The entire examined colon is normal.                           -  Non-bleeding internal hemorrhoids.                           - No specimens collected. Recommendation:           - Patient has a contact number available for                            emergencies. The signs and symptoms of potential                            delayed complications were discussed with the                            patient. Return to normal activities tomorrow.                            Written discharge instructions were provided to the  patient.                           - Resume previous diet.                           - Continue present medications.                           - Repeat colonoscopy is not recommended due to                            current age (48 years or older).                           - Return to GI clinic PRN. Doristine Locks, MD 12/29/2020 11:55:55 AM

## 2020-12-29 NOTE — Patient Instructions (Signed)
Please read handouts provided. Continue present medications. Return to GI clinic as needed.  YOU HAD AN ENDOSCOPIC PROCEDURE TODAY AT THE [facility]:   Refer to the procedure report that was given to you for any specific questions about what was found during the examination.  If the procedure report does not answer your questions, please call your gastroenterologist to clarify.  If you requested that your care partner not be given the details of your procedure findings, then the procedure report has been included in a sealed envelope for you to review at your convenience later.  YOU SHOULD EXPECT: Some feelings of bloating in the abdomen. Passage of more gas than usual.  Walking can help get rid of the air that was put into your GI tract during the procedure and reduce the bloating. If you had a lower endoscopy (such as a colonoscopy or flexible sigmoidoscopy) you may notice spotting of blood in your stool or on the toilet paper. If you underwent a bowel prep for your procedure, you may not have a normal bowel movement for a few days.  Please Note:  You might notice some irritation and congestion in your nose or some drainage.  This is from the oxygen used during your procedure.  There is no need for concern and it should clear up in a day or so.  SYMPTOMS TO REPORT IMMEDIATELY:  Following lower endoscopy (colonoscopy or flexible sigmoidoscopy):  Excessive amounts of blood in the stool  Significant tenderness or worsening of abdominal pains  Swelling of the abdomen that is new, acute  Fever of 100F or higher  Following upper endoscopy (EGD)  Vomiting of blood or coffee ground material  New chest pain or pain under the shoulder blades  Painful or persistently difficult swallowing  New shortness of breath  Fever of 100F or higher  Black, tarry-looking stools  For urgent or emergent issues, a gastroenterologist can be reached at any hour by calling [phone]. Do not use  MyChart messaging for urgent concerns.    DIET:  We do recommend a small meal at first, but then you may proceed to your regular diet.  Drink plenty of fluids but you should avoid alcoholic beverages for 24 hours.  ACTIVITY:  You should plan to take it easy for the rest of today and you should NOT DRIVE or use heavy machinery until tomorrow (because of the sedation medicines used during the test).    FOLLOW UP: Our staff will call the number listed on your records 48-72 hours following your procedure to check on you and address any questions or concerns that you may have regarding the information given to you following your procedure. If we do not reach you, we will leave a message.  We will attempt to reach you two times.  During this call, we will ask if you have developed any symptoms of COVID 19. If you develop any symptoms (ie: fever, flu-like symptoms, shortness of breath, cough etc.) before then, please call [phone].  If you test positive for Covid 19 in the 2 weeks post procedure, please call and report this information to [place].    If any biopsies were taken you will be contacted by phone or by letter within the next 1-3 weeks.  Please call us at [phone] if you have not heard about the biopsies in 3 weeks.    SIGNATURES/CONFIDENTIALITY: You and/or your care partner have signed paperwork which will be entered into your electronic medical record.  These signatures attest to the fact that that the information above on your After Visit Summary has been reviewed and is understood.  Full responsibility of the confidentiality of this discharge information lies with you and/or your care-partner. YOU HAD AN ENDOSCOPIC PROCEDURE TODAY AT THE [facility]:   Refer to the procedure report that was given to you for any specific questions about what was found during the examination.  If the procedure report does not answer your questions, please call your gastroenterologist to clarify.   If you requested that your care partner not be given the details of your procedure findings, then the procedure report has been included in a sealed envelope for you to review at your convenience later.  YOU SHOULD EXPECT: Some feelings of bloating in the abdomen. Passage of more gas than usual.  Walking can help get rid of the air that was put into your GI tract during the procedure and reduce the bloating. If you had a lower endoscopy (such as a colonoscopy or flexible sigmoidoscopy) you may notice spotting of blood in your stool or on the toilet paper. If you underwent a bowel prep for your procedure, you may not have a normal bowel movement for a few days.  Please Note:  You might notice some irritation and congestion in your nose or some drainage.  This is from the oxygen used during your procedure.  There is no need for concern and it should clear up in a day or so.  SYMPTOMS TO REPORT IMMEDIATELY:  Following lower endoscopy (colonoscopy or flexible sigmoidoscopy):  Excessive amounts of blood in the stool  Significant tenderness or worsening of abdominal pains  Swelling of the abdomen that is new, acute  Fever of 100F or higher  Following upper endoscopy (EGD)  Vomiting of blood or coffee ground material  New chest pain or pain under the shoulder blades  Painful or persistently difficult swallowing  New shortness of breath  Fever of 100F or higher  Black, tarry-looking stools  For urgent or emergent issues, a gastroenterologist can be reached at any hour by calling [phone]. Do not use MyChart messaging for urgent concerns.    DIET:  We do recommend a small meal at first, but then you may proceed to your regular diet.  Drink plenty of fluids but you should avoid alcoholic beverages for 24 hours.  ACTIVITY:  You should plan to take it easy for the rest of today and you should NOT DRIVE or use heavy machinery until tomorrow (because of the sedation medicines used during the  test).    FOLLOW UP: Our staff will call the number listed on your records 48-72 hours following your procedure to check on you and address any questions or concerns that you may have regarding the information given to you following your procedure. If we do not reach you, we will leave a message.  We will attempt to reach you two times.  During this call, we will ask if you have developed any symptoms of COVID 19. If you develop any symptoms (ie: fever, flu-like symptoms, shortness of breath, cough etc.) before then, please call [phone].  If you test positive for Covid 19 in the 2 weeks post procedure, please call and report this information to [place].    If any biopsies were taken you will be contacted by phone or by letter within the next 1-3 weeks.  Please call us at [phone] if you have not heard about the biopsies  in 3 weeks.    SIGNATURES/CONFIDENTIALITY: You and/or your care partner have signed paperwork which will be entered into your electronic medical record.  These signatures attest to the fact that that the information above on your After Visit Summary has been reviewed and is understood.  Full responsibility of the confidentiality of this discharge information lies with you and/or your care-partner.

## 2020-12-29 NOTE — Progress Notes (Signed)
To PACU, VSS. Report to [email or web address] 

## 2020-12-29 NOTE — Progress Notes (Signed)
GASTROENTEROLOGY PROCEDURE H&P NOTE   Primary Care Physician: Mliss Sax, MD    Reason for Procedure:  Colon Cancer screening  Plan:    Colonoscopy  Patient is appropriate for endoscopic procedure(s) in the ambulatory ([facility]) setting.  The nature of the procedure, as well as the risks, benefits, and alternatives were carefully and thoroughly reviewed with the patient. Ample time for discussion and questions allowed. The patient understood, was satisfied, and agreed to proceed.     HPI: Joanna Rowe is a 70 y.o. female who presents for colonoscopy for routine Colon Cancer screening.  No active GI symptoms.  No known family history of colon cancer or related malignancy.  Patient is otherwise without complaints or active issues today.  Endoscopic Hx: - Colonoscopy (10/13/2005, Dr. [place] at [facility]): Tortuous colon and unable to get into the tip of the cecum.  Prominent fold vs flat polyp in ascending colon (path: Focal lymphoid hyperplasia without adenomatous change).  Otherwise normal. - Colonoscopy (11/18/2010, Dr. [place]am [place] at [facility]): Patulous colon.  1 diverticulum seen and scar in the sigmoid from prior polypectomy.  Otherwise normal.  Past Medical History:  Diagnosis Date   Allergy    Cataract    PVC's (premature ventricular contractions)    Thyroid disease    Thyroid nodule     Past Surgical History:  Procedure Laterality Date   BIOPSY BREAST     BREAST EXCISIONAL BIOPSY Left    TONSILLECTOMY AND ADENOIDECTOMY      Prior to Admission medications   Medication Sig Start Date End Date Taking? Authorizing Provider  calcium citrate-vitamin D (CITRACAL+D) 315-200 MG-UNIT tablet Take 1 tablet by mouth 2 (two) times daily.   Yes [provider]  cholecalciferol (VITAMIN D3) 25 MCG (1000 UNIT) tablet Take 1,000 Units by mouth daily.   Yes [provider]  Multiple Vitamin (MULTIVITAMIN) capsule Take 1  capsule by mouth daily.   Yes [provider]  Polyethylene Glycol 3350 (MIRALAX PO) Take 238 g by mouth. colonoscopy   Yes [provider]  RESTASIS 0.05 % ophthalmic emulsion 1 drop 2 (two) times daily. 09/02/20  Yes [provider]    Current Outpatient Medications  Medication Sig Dispense Refill   calcium citrate-vitamin D (CITRACAL+D) 315-200 MG-UNIT tablet Take 1 tablet by mouth 2 (two) times daily.     cholecalciferol (VITAMIN D3) 25 MCG (1000 UNIT) tablet Take 1,000 Units by mouth daily.     Multiple Vitamin (MULTIVITAMIN) capsule Take 1 capsule by mouth daily.     Polyethylene Glycol 3350 (MIRALAX PO) Take 238 g by mouth. colonoscopy     RESTASIS 0.05 % ophthalmic emulsion 1 drop 2 (two) times daily.     Current Facility-Administered Medications  Medication Dose Route Frequency Provider Last Rate Last Admin   0.9 %  sodium chloride infusion  500 mL Intravenous Once Roxy Filler V, DO        Allergies as of 12/29/2020   (Not on File)    Family History  Problem Relation Age of Onset   Diabetes Mother    Heart disease Father    Breast cancer Maternal Aunt    Colon cancer Neg Hx    Colon polyps Neg Hx    Esophageal cancer Neg Hx    Stomach cancer Neg Hx    Rectal cancer Neg Hx     Social History   Socioeconomic History   Marital status: Widowed    Spouse name:  Not on file   Number of children: Not on file   Years of education: Not on file   Highest education level: Not on file  Occupational History   Not on file  Tobacco Use   Smoking status: Never   Smokeless tobacco: Never  Vaping Use   Vaping Use: Never used  Substance and Sexual Activity   Alcohol use: Yes    Comment: 1 glass a week    Drug use: Never   Sexual activity: Not on file  Other Topics Concern   Not on file  Social History Narrative   Not on file   Social Determinants of Health   Financial Resource Strain: Not on file  Food Insecurity: Not on file   Transportation Needs: Not on file  Physical Activity: Not on file  Stress: Not on file  Social Connections: Not on file  Intimate Partner Violence: Not on file    Physical Exam: Vital signs in last 24 hours: @BP  128/60   Pulse 80   Temp (!) 96.6 F (35.9 C) (Temporal)   Ht 5\' 5"  (1.651 m)   Wt 135 lb (61.2 kg)   SpO2 100%   BMI 22.47 kg/m  GEN: NAD EYE: Sclerae anicteric ENT: MMM CV: Non-tachycardic Pulm: CTA b/l GI: Soft, NT/ND NEURO:  Alert & Oriented x 3   , DO [facility]   12/29/2020 11:26 AM

## 2020-12-29 NOTE — Progress Notes (Signed)
DT- VS ° °Pt's states no medical or surgical changes since previsit or office visit.  °

## 2020-12-31 ENCOUNTER — Telehealth: Payer: Self-pay | Admitting: *Deleted

## 2020-12-31 NOTE — Telephone Encounter (Signed)
  Follow up Call-  Call back number 12/29/2020  Post procedure Call Back phone  # [phone]  Permission to leave phone message Yes     Patient questions:  Do you have a fever, pain , or abdominal swelling? No. Pain Score  0 *  Have you tolerated food without any problems? Yes.    Have you been able to return to your normal activities? Yes.    Do you have any questions about your discharge instructions: Diet   No. Medications  No. Follow up visit  No.  Do you have questions or concerns about your Care? No.  Actions: * If pain score is 4 or above: No action needed, pain <4.Have you developed a fever since your procedure? no  2.   Have you had an respiratory symptoms (SOB or cough) since your procedure? no  3.   Have you tested positive for COVID 19 since your procedure no  4.   Have you had any family members/close contacts diagnosed with the COVID 19 since your procedure?  no   If yes to any of these questions please route to Laverna Peace, RN and Karlton Lemon, RN

## 2021-01-18 ENCOUNTER — Telehealth: Payer: Self-pay | Admitting: Family Medicine

## 2021-01-18 NOTE — Telephone Encounter (Signed)
Left message for patient to call back and schedule Medicare Annual Wellness Visit (AWV) in office.  ° °If not able to come in office, please offer to do virtually or by telephone.  Left office number and my jabber #[phone]. ° °Due for AWVI ° °Please schedule at anytime with Nurse Health Advisor. °  °

## 2021-01-26 ENCOUNTER — Ambulatory Visit: Payer: Medicare Other

## 2021-02-02 ENCOUNTER — Ambulatory Visit (INDEPENDENT_AMBULATORY_CARE_PROVIDER_SITE_OTHER): Payer: Medicare Other

## 2021-02-02 DIAGNOSIS — Z0000 Encounter for general adult medical examination without abnormal findings: Secondary | ICD-10-CM | POA: Diagnosis not present

## 2021-02-02 NOTE — Progress Notes (Signed)
Subjective:   Joanna Rowe is a 70 y.o. female who presents for an Initial Medicare Annual Wellness Visit.  I connected with Joanna Rowe today by telephone and verified that I am speaking with the correct person using two identifiers. Location patient: home Location provider: work Persons participating in the virtual visit: patient, provider.   I discussed the limitations, risks, security and privacy concerns of performing an evaluation and management service by telephone and the availability of in person appointments. I also discussed with the patient that there may be a patient responsible charge related to this service. The patient expressed understanding and verbally consented to this telephonic visit.    Interactive audio and video telecommunications were attempted between this provider and patient, however failed, due to patient having technical difficulties OR patient did not have access to video capability.  We continued and completed visit with audio only.    Review of Systems     Cardiac Risk Factors include: advanced age (>56men, >71 women)     Objective:    Today's Vitals   There is no height or weight on file to calculate BMI.  Advanced Directives 02/02/2021  Does Patient Have a Medical Advance Directive? Yes  Type of Estate agent of [place];Living will  Copy of Healthcare Power of Attorney in Chart? No - copy requested    Current Medications (verified) Outpatient Encounter Medications as of 02/02/2021  Medication Sig   calcium citrate-vitamin D (CITRACAL+D) 315-200 MG-UNIT tablet Take 1 tablet by mouth 2 (two) times daily.   cholecalciferol (VITAMIN D3) 25 MCG (1000 UNIT) tablet Take 1,000 Units by mouth daily.   Multiple Vitamin (MULTIVITAMIN) capsule Take 1 capsule by mouth daily.   RESTASIS 0.05 % ophthalmic emulsion 1 drop 2 (two) times daily.   Polyethylene Glycol 3350 (MIRALAX PO) Take 238 g by mouth. colonoscopy   No  facility-administered encounter medications on file as of 02/02/2021.    Allergies (verified) Patient has no allergy information on record.   History: Past Medical History:  Diagnosis Date   Allergy    Cataract    PVC's (premature ventricular contractions)    Thyroid disease    Thyroid nodule    Past Surgical History:  Procedure Laterality Date   BIOPSY BREAST     BREAST EXCISIONAL BIOPSY Left    TONSILLECTOMY AND ADENOIDECTOMY     Family History  Problem Relation Age of Onset   Diabetes Mother    Heart disease Father    Breast cancer Maternal Aunt    Colon cancer Neg Hx    Colon polyps Neg Hx    Esophageal cancer Neg Hx    Stomach cancer Neg Hx    Rectal cancer Neg Hx    Social History   Socioeconomic History   Marital status: Widowed    Spouse name: Not on file   Number of children: Not on file   Years of education: Not on file   Highest education level: Not on file  Occupational History   Not on file  Tobacco Use   Smoking status: Never   Smokeless tobacco: Never  Vaping Use   Vaping Use: Never used  Substance and Sexual Activity   Alcohol use: Yes    Comment: 1 glass a week    Drug use: Never   Sexual activity: Not on file  Other Topics Concern   Not on file  Social History Narrative   Not on file   Social Determinants of Health  Financial Resource Strain: Low Risk    Difficulty of Paying Living Expenses: Not hard at all  Food Insecurity: No Food Insecurity   Worried About Programme researcher, broadcasting/film/video in the Last Year: Never true   Ran Out of Food in the Last Year: Never true  Transportation Needs: No Transportation Needs   Lack of Transportation (Medical): No   Lack of Transportation (Non-Medical): No  Physical Activity: Sufficiently Active   Days of Exercise per Week: 6 days   Minutes of Exercise per Session: 60 min  Stress: No Stress Concern Present   Feeling of Stress : Not at all  Social Connections: Moderately Isolated   Frequency of  Communication with Friends and Family: Twice a week   Frequency of Social Gatherings with Friends and Family: Twice a week   Attends Religious Services: Never   Database administrator or Organizations: Yes   Attends Banker Meetings: 1 to 4 times per year   Marital Status: Widowed    Tobacco Counseling Counseling given: Not Answered   Clinical Intake:  Pre-visit preparation completed: Yes  Pain : No/denies pain     Nutritional Risks: None Diabetes: No  How often do you need to have someone help you when you read instructions, pamphlets, or other written materials from your doctor or pharmacy?: 1 - Never What is the last grade level you completed in school?: bs  Diabetic?no  Interpreter Needed?: No  Information entered by :: L.Jony Ladnier,LPN   Activities of Daily Living In your present state of health, do you have any difficulty performing the following activities: 02/02/2021  Hearing? N  Vision? N  Difficulty concentrating or making decisions? N  Walking or climbing stairs? N  Dressing or bathing? N  Doing errands, shopping? N  Preparing Food and eating ? N  Using the Toilet? N  In the past six months, have you accidently leaked urine? N  Do you have problems with loss of bowel control? N  Managing your Medications? N  Managing your Finances? N  Housekeeping or managing your Housekeeping? N  Some recent data might be hidden    Patient Care Team: Mliss Sax, MD as PCP - General (Family Medicine)  Indicate any recent Medical Services you may have received from other than [facility] providers in the past year (date may be approximate).     Assessment:   This is a routine wellness examination for Joanna Rowe.  Hearing/Vision screen Vision Screening - Comments:: Annual eye exams wears glasses   Dietary issues and exercise activities discussed: Current Exercise Habits: Home exercise routine, Type of exercise: walking, Time (Minutes): 60, Frequency  (Times/Week): 6, Weekly Exercise (Minutes/Week): 360, Intensity: Moderate, Exercise limited by: None identified   Goals Addressed   None    Depression Screen PHQ 2/9 Scores 02/02/2021 02/02/2021 08/24/2020 07/26/2020 07/26/2020  PHQ - 2 Score 0 0 0 0 0  PHQ- 9 Score - - - 0 -    Fall Risk Fall Risk  02/02/2021 08/24/2020 07/26/2020  Falls in the past year? 0 0 0  Number falls in past yr: 0 - -  Injury with Fall? 0 - -  Follow up Falls evaluation completed - -    FALL RISK PREVENTION PERTAINING TO THE HOME:  Any stairs in or around the home? Yes  If so, are there any without handrails? No  Home free of loose throw rugs in walkways, pet beds, electrical cords, etc? Yes  Adequate lighting in your home to reduce  risk of falls? Yes   ASSISTIVE DEVICES UTILIZED TO PREVENT FALLS:  Life alert? No  Use of a cane, walker or w/c? No  Grab bars in the bathroom? No  Shower chair or bench in shower? Yes  Elevated toilet seat or a handicapped toilet? No    Cognitive Function:  Normal cognitive status assessed by direct observation by this Nurse Health Advisor. No abnormalities found.        Immunizations Immunization History  Administered Date(s) Administered   Influenza-Unspecified 12/23/2019   PFIZER(Purple Top)SARS-COV-2 Vaccination 05/07/2019, 06/01/2019, 07/23/2020    TDAP status: Up to date  Flu Vaccine status: Up to date  Pneumococcal vaccine status: Up to date  Covid-19 vaccine status: Completed vaccines  Qualifies for Shingles Vaccine? Yes   Zostavax completed Yes   Shingrix Completed?: No.    Education has been provided regarding the importance of this vaccine. Patient has been advised to call insurance company to determine out of pocket expense if they have not yet received this vaccine. Advised may also receive vaccine at local pharmacy or Health Dept. Verbalized acceptance and understanding.  Screening Tests Health Maintenance  Topic Date Due   Hepatitis C Screening   Never done   Zoster Vaccines- Shingrix (1 of 2) Never done   Pneumonia Vaccine 43+ Years old (1 - PCV) Never done   COVID-19 Vaccine (4 - Booster for Pfizer series) 09/17/2020   INFLUENZA VACCINE  10/25/2020   MAMMOGRAM  08/02/2021   COLONOSCOPY (Pts 45-46yrs Insurance coverage will need to be confirmed)  12/30/2030   DEXA SCAN  Completed   HPV VACCINES  Aged Out    Health Maintenance  Health Maintenance Due  Topic Date Due   Hepatitis C Screening  Never done   Zoster Vaccines- Shingrix (1 of 2) Never done   Pneumonia Vaccine 18+ Years old (1 - PCV) Never done   COVID-19 Vaccine (4 - Booster for Pfizer series) 09/17/2020   INFLUENZA VACCINE  10/25/2020    Colorectal cancer screening: Type of screening: Colonoscopy. Completed 12/29/2020. Repeat every 10 years  Mammogram status: Completed 08/02/2020. Repeat every year  Bone Density status: Completed 08/02/2020. Results reflect: Bone density results: OSTEOPENIA. Repeat every 5 years.  Lung Cancer Screening: (Low Dose CT Chest recommended if Age 67-80 years, 30 pack-year currently smoking OR have quit w/in 15years.) does not qualify.   Lung Cancer Screening Referral: n/a  Additional Screening:  Hepatitis C Screening: does qualify;   Vision Screening: Recommended annual ophthalmology exams for early detection of glaucoma and other disorders of the eye. Is the patient up to date with their annual eye exam?  Yes  Who is the provider or what is the name of the office in which the patient attends annual eye exams? Dr.Hecker  If pt is not established with a provider, would they like to be referred to a provider to establish care? No .   Dental Screening: Recommended annual dental exams for proper oral hygiene  Community Resource Referral / Chronic Care Management: CRR required this visit?  No   CCM required this visit?  No      Plan:     I have personally reviewed and noted the following in the patient's chart:   Medical  and social history Use of alcohol, tobacco or illicit drugs  Current medications and supplements including opioid prescriptions. Patient is not currently taking opioid prescriptions. Functional ability and status Nutritional status Physical activity Advanced directives List of other physicians Hospitalizations, surgeries, and ER  visits in previous 12 months Vitals Screenings to include cognitive, depression, and falls Referrals and appointments  In addition, I have reviewed and discussed with patient certain preventive protocols, quality metrics, and best practice recommendations. A written personalized care plan for preventive services as well as general preventive health recommendations were provided to patient.     March Rummage, LPN   06/02/8826   Nurse Notes: none

## 2021-02-02 NOTE — Patient Instructions (Signed)
Joanna Rowe , Thank you for taking time to come for your Medicare Wellness Visit. I appreciate your ongoing commitment to your health goals. Please review the following plan we discussed and let me know if I can assist you in the future.   Screening recommendations/referrals: Colonoscopy: 12/29/2020 Mammogram: 08/02/2020 Bone Density: 08/02/2020 Recommended yearly ophthalmology/optometry visit for glaucoma screening and checkup Recommended yearly dental visit for hygiene and checkup  Vaccinations: Influenza vaccine: completed  Pneumococcal vaccine: completed per patient in [place]  Tdap vaccine: completed per patient in [place]  Shingles vaccine: completed per patient in [place]     Advanced directives: will provide copies   Conditions/risks identified: none   Next appointment: none    Preventive Care 65 Years and Older, Female Preventive care refers to lifestyle choices and visits with your health care provider that can promote health and wellness. What does preventive care include? A yearly physical exam. This is also called an annual well check. Dental exams once or twice a year. Routine eye exams. Ask your health care provider how often you should have your eyes checked. Personal lifestyle choices, including: Daily care of your teeth and gums. Regular physical activity. Eating a healthy diet. Avoiding tobacco and drug use. Limiting alcohol use. Practicing safe sex. Taking low-dose aspirin every day. Taking vitamin and mineral supplements as recommended by your health care provider. What happens during an annual well check? The services and screenings done by your health care provider during your annual well check will depend on your age, overall health, lifestyle risk factors, and family history of disease. Counseling  Your health care provider may ask you questions about your: Alcohol use. Tobacco use. Drug use. Emotional well-being. Home and  relationship well-being. Sexual activity. Eating habits. History of falls. Memory and ability to understand (cognition). Work and work Astronomer. Reproductive health. Screening  You may have the following tests or measurements: Height, weight, and BMI. Blood pressure. Lipid and cholesterol levels. These may be checked every 5 years, or more frequently if you are over 57 years old. Skin check. Lung cancer screening. You may have this screening every year starting at age 3 if you have a 30-pack-year history of smoking and currently smoke or have quit within the past 15 years. Fecal occult blood test (FOBT) of the stool. You may have this test every year starting at age 82. Flexible sigmoidoscopy or colonoscopy. You may have a sigmoidoscopy every 5 years or a colonoscopy every 10 years starting at age 75. Hepatitis C blood test. Hepatitis B blood test. Sexually transmitted disease (STD) testing. Diabetes screening. This is done by checking your blood sugar (glucose) after you have not eaten for a while (fasting). You may have this done every 1-3 years. Bone density scan. This is done to screen for osteoporosis. You may have this done starting at age 40. Mammogram. This may be done every 1-2 years. Talk to your health care provider about how often you should have regular mammograms. Talk with your health care provider about your test results, treatment options, and if necessary, the need for more tests. Vaccines  Your health care provider may recommend certain vaccines, such as: Influenza vaccine. This is recommended every year. Tetanus, diphtheria, and acellular pertussis (Tdap, Td) vaccine. You may need a Td booster every 10 years. Zoster vaccine. You may need this after age 47. Pneumococcal 13-valent conjugate (PCV13) vaccine. One dose is recommended after age 38. Pneumococcal polysaccharide (PPSV23) vaccine. One dose is recommended after age 53. Talk to  your health care provider  about which screenings and vaccines you need and how often you need them. This information is not intended to replace advice given to you by your health care provider. Make sure you discuss any questions you have with your health care provider. Document Released: 04/09/2015 Document Revised: 12/01/2015 Document Reviewed: 01/12/2015 Elsevier Interactive Patient Education  2017 [place] Prevention in the Home Falls can cause injuries. They can happen to people of all ages. There are many things you can do to make your home safe and to help prevent falls. What can I do on the outside of my home? Regularly fix the edges of walkways and driveways and fix any cracks. Remove anything that might make you trip as you walk through a door, such as a raised step or threshold. Trim any bushes or trees on the path to your home. Use bright outdoor lighting. Clear any walking paths of anything that might make someone trip, such as rocks or tools. Regularly check to see if handrails are loose or broken. Make sure that both sides of any steps have handrails. Any raised decks and porches should have guardrails on the edges. Have any leaves, snow, or ice cleared regularly. Use sand or salt on walking paths during winter. Clean up any spills in your garage right away. This includes oil or grease spills. What can I do in the bathroom? Use night lights. Install grab bars by the toilet and in the tub and shower. Do not use towel bars as grab bars. Use non-skid mats or decals in the tub or shower. If you need to sit down in the shower, use a plastic, non-slip stool. Keep the floor dry. Clean up any water that spills on the floor as soon as it happens. Remove soap buildup in the tub or shower regularly. Attach bath mats securely with double-sided non-slip rug tape. Do not have throw rugs and other things on the floor that can make you trip. What can I do in the bedroom? Use night lights. Make sure  that you have a light by your bed that is easy to reach. Do not use any sheets or blankets that are too big for your bed. They should not hang down onto the floor. Have a firm chair that has side arms. You can use this for support while you get dressed. Do not have throw rugs and other things on the floor that can make you trip. What can I do in the kitchen? Clean up any spills right away. Avoid walking on wet floors. Keep items that you use a lot in easy-to-reach places. If you need to reach something above you, use a strong step stool that has a grab bar. Keep electrical cords out of the way. Do not use floor polish or wax that makes floors slippery. If you must use wax, use non-skid floor wax. Do not have throw rugs and other things on the floor that can make you trip. What can I do with my stairs? Do not leave any items on the stairs. Make sure that there are handrails on both sides of the stairs and use them. Fix handrails that are broken or loose. Make sure that handrails are as long as the stairways. Check any carpeting to make sure that it is firmly attached to the stairs. Fix any carpet that is loose or worn. Avoid having throw rugs at the top or bottom of the stairs. If you do have throw rugs, attach  them to the floor with carpet tape. Make sure that you have a light switch at the top of the stairs and the bottom of the stairs. If you do not have them, ask someone to add them for you. What else can I do to help prevent falls? Wear shoes that: Do not have high heels. Have rubber bottoms. Are comfortable and fit you well. Are closed at the toe. Do not wear sandals. If you use a stepladder: Make sure that it is fully opened. Do not climb a closed stepladder. Make sure that both sides of the stepladder are locked into place. Ask someone to hold it for you, if possible. Clearly mark and make sure that you can see: Any grab bars or handrails. First and last steps. Where the edge of  each step is. Use tools that help you move around (mobility aids) if they are needed. These include: Canes. Walkers. Scooters. Crutches. Turn on the lights when you go into a dark area. Replace any light bulbs as soon as they burn out. Set up your furniture so you have a clear path. Avoid moving your furniture around. If any of your floors are uneven, fix them. If there are any pets around you, be aware of where they are. Review your medicines with your doctor. Some medicines can make you feel dizzy. This can increase your chance of falling. Ask your doctor what other things that you can do to help prevent falls. This information is not intended to replace advice given to you by your health care provider. Make sure you discuss any questions you have with your health care provider. Document Released: 01/07/2009 Document Revised: 08/19/2015 Document Reviewed: 04/17/2014 Elsevier Interactive Patient Education  2017 [employer].

## 2021-03-31 ENCOUNTER — Ambulatory Visit (INDEPENDENT_AMBULATORY_CARE_PROVIDER_SITE_OTHER): Payer: Medicare Other | Admitting: Family Medicine

## 2021-03-31 ENCOUNTER — Other Ambulatory Visit: Payer: Self-pay

## 2021-03-31 ENCOUNTER — Encounter: Payer: Self-pay | Admitting: Family Medicine

## 2021-03-31 VITALS — BP 118/68 | HR 91 | Temp 96.7°F | Ht 65.0 in | Wt 131.2 lb

## 2021-03-31 DIAGNOSIS — M816 Localized osteoporosis [Lequesne]: Secondary | ICD-10-CM | POA: Diagnosis not present

## 2021-03-31 DIAGNOSIS — R350 Frequency of micturition: Secondary | ICD-10-CM

## 2021-03-31 DIAGNOSIS — Z0000 Encounter for general adult medical examination without abnormal findings: Secondary | ICD-10-CM | POA: Diagnosis not present

## 2021-03-31 LAB — CBC
HCT: 40.3 % (ref 36.0–46.0)
Hemoglobin: 13.3 g/dL (ref 12.0–15.0)
MCHC: 33.1 g/dL (ref 30.0–36.0)
MCV: 96.2 fl (ref 78.0–100.0)
Platelets: 183 10*3/uL (ref 150.0–400.0)
RBC: 4.19 Mil/uL (ref 3.87–5.11)
RDW: 13.3 % (ref 11.5–15.5)
WBC: 5.8 10*3/uL (ref 4.0–10.5)

## 2021-03-31 MED ORDER — ALENDRONATE SODIUM 70 MG PO TABS
70.0000 mg | ORAL_TABLET | ORAL | 11 refills | Status: AC
Start: 2021-03-31 — End: ?

## 2021-03-31 NOTE — Progress Notes (Signed)
Established Patient Office Visit  Subjective:  Patient ID: Joanna Rowe, female    DOB: 24-Aug-1950  Age: 71 y.o. MRN: [id number]  CC:  Chief Complaint  Patient presents with   Hypertension    Concerns about elevated BP readings at home, recheck thyroid. Referral to Gynecologist.     HPI Joanna Rowe presents for follow-up of recent DEXA scan that showed osteoporosis.  She has been treated in the distant past with Actonel that did not seem to make much of a difference.  She had no issues taking it.  She has no significant pill issues.  She is a Pharmacist, community regularly.  Recent cataract surgery complicated with corneal abrasion.  She is doing better now.  Ophthalmology question hypertensive changes in her eyes.  Patient has no history of hypertension.  She has been monitoring her pressures which have been in the 1 teen range over 60 range.  Rare excursions up to the upper 130s to 60s.  Past Medical History:  Diagnosis Date   Allergy    Cataract    PVC's (premature ventricular contractions)    Thyroid disease    Thyroid nodule     Past Surgical History:  Procedure Laterality Date   BIOPSY BREAST     BREAST EXCISIONAL BIOPSY Left    TONSILLECTOMY AND ADENOIDECTOMY      Family History  Problem Relation Age of Onset   Diabetes Mother    Heart disease Father    Breast cancer Maternal Aunt    Colon cancer Neg Hx    Colon polyps Neg Hx    Esophageal cancer Neg Hx    Stomach cancer Neg Hx    Rectal cancer Neg Hx     Social History   Socioeconomic History   Marital status: Widowed    Spouse name: Not on file   Number of children: Not on file   Years of education: Not on file   Highest education level: Not on file  Occupational History   Not on file  Tobacco Use   Smoking status: Never   Smokeless tobacco: Never  Vaping Use   Vaping Use: Never used  Substance and Sexual Activity   Alcohol use: Yes    Comment: 1 glass a week    Drug use: Never   Sexual activity:  Not on file  Other Topics Concern   Not on file  Social History Narrative   Not on file   Social Determinants of Health   Financial Resource Strain: Low Risk    Difficulty of Paying Living Expenses: Not hard at all  Food Insecurity: No Food Insecurity   Worried About Charity fundraiser in the Last Year: Never true   [place] in the Last Year: Never true  Transportation Needs: No Transportation Needs   Lack of Transportation (Medical): No   Lack of Transportation (Non-Medical): No  Physical Activity: Sufficiently Active   Days of Exercise per Week: 6 days   Minutes of Exercise per Session: 60 min  Stress: No Stress Concern Present   Feeling of Stress : Not at all  Social Connections: Moderately Isolated   Frequency of Communication with Friends and Family: Twice a week   Frequency of Social Gatherings with Friends and Family: Twice a week   Attends Religious Services: Never   Marine scientist or Organizations: Yes   Attends Archivist Meetings: 1 to 4 times per year   Marital Status: Widowed  Intimate Partner Violence:  Not At Risk   Fear of Current or Ex-Partner: No   Emotionally Abused: No   Physically Abused: No   Sexually Abused: No    Outpatient Medications Prior to Visit  Medication Sig Dispense Refill   calcium citrate-vitamin D (CITRACAL+D) 315-200 MG-UNIT tablet Take 1 tablet by mouth 2 (two) times daily.     cholecalciferol (VITAMIN D3) 25 MCG (1000 UNIT) tablet Take 1,000 Units by mouth daily.     Multiple Vitamin (MULTIVITAMIN) capsule Take 1 capsule by mouth daily.     RESTASIS 0.05 % ophthalmic emulsion 1 drop 2 (two) times daily.     Polyethylene Glycol 3350 (MIRALAX PO) Take 238 g by mouth. colonoscopy     No facility-administered medications prior to visit.    Not on File  ROS Review of Systems  Constitutional:  Negative for diaphoresis, fatigue, fever and unexpected weight change.  HENT: Negative.    Eyes:  Negative for  photophobia and visual disturbance.  Respiratory: Negative.    Cardiovascular: Negative.   Gastrointestinal: Negative.   Endocrine: Negative for polyphagia and polyuria.  Genitourinary:  Positive for frequency.     Objective:    Physical Exam Vitals and nursing note reviewed.  Constitutional:      General: She is not in acute distress.    Appearance: Normal appearance. She is normal weight. She is not ill-appearing, toxic-appearing or diaphoretic.  HENT:     Head: Normocephalic and atraumatic.     Right Ear: External ear normal.     Left Ear: External ear normal.     Mouth/Throat:     Mouth: Mucous membranes are moist.     Pharynx: Oropharynx is clear. No oropharyngeal exudate or posterior oropharyngeal erythema.  Eyes:     General: No scleral icterus.       Right eye: No discharge.        Left eye: No discharge.     Extraocular Movements: Extraocular movements intact.     Conjunctiva/sclera: Conjunctivae normal.     Pupils: Pupils are equal, round, and reactive to light.  Cardiovascular:     Rate and Rhythm: Normal rate and regular rhythm.  Pulmonary:     Effort: Pulmonary effort is normal.     Breath sounds: Normal breath sounds.  Abdominal:     Tenderness: There is no right CVA tenderness or left CVA tenderness.  Musculoskeletal:     Cervical back: No rigidity or tenderness.  Lymphadenopathy:     Cervical: No cervical adenopathy.  Skin:    General: Skin is warm and dry.  Neurological:     Mental Status: She is alert and oriented to person, place, and time.  Psychiatric:        Behavior: Behavior normal.    BP 118/68 (BP Location: Left Arm, Patient Position: Sitting, Cuff Size: Normal)    Pulse 91    Temp (!) 96.7 F (35.9 C) (Temporal)    Ht 5\' 5"  (1.651 m)    Wt 131 lb 3.2 oz (59.5 kg)    SpO2 96%    BMI 21.83 kg/m  Wt Readings from Last 3 Encounters:  03/31/21 131 lb 3.2 oz (59.5 kg)  12/29/20 135 lb (61.2 kg)  12/15/20 130 lb (59 kg)     Health  Maintenance Due  Topic Date Due   Hepatitis C Screening  Never done    There are no preventive care reminders to display for this patient.  Lab Results  Component Value Date   TSH  3.68 08/20/2020   Lab Results  Component Value Date   WBC 4.0 08/20/2020   HGB 13.4 08/20/2020   HCT 39.5 08/20/2020   MCV 95.8 08/20/2020   PLT 170.0 08/20/2020   Lab Results  Component Value Date   NA 141 08/06/2020   K 4.0 08/06/2020   CO2 29 08/06/2020   GLUCOSE 99 08/06/2020   BUN 23 08/06/2020   CREATININE 0.71 08/06/2020   BILITOT 0.5 08/06/2020   ALKPHOS 53 08/06/2020   AST 22 08/06/2020   ALT 21 08/06/2020   PROT 6.3 08/06/2020   ALBUMIN 4.3 08/06/2020   CALCIUM 9.3 08/06/2020   GFR 85.91 08/06/2020   Lab Results  Component Value Date   CHOL 218 (H) 08/20/2020   Lab Results  Component Value Date   HDL 97.30 08/20/2020   Lab Results  Component Value Date   LDLCALC 109 (H) 08/20/2020   Lab Results  Component Value Date   TRIG 58.0 08/20/2020   Lab Results  Component Value Date   CHOLHDL 2 08/20/2020   Lab Results  Component Value Date   HGBA1C 5.8 08/06/2020      Assessment & Plan:   Problem List Items Addressed This Visit       Musculoskeletal and Integument   Localized osteoporosis without current pathological fracture   Relevant Medications   alendronate (FOSAMAX) 70 MG tablet   Other Visit Diagnoses     Healthcare maintenance    -  Primary   Relevant Orders   Ambulatory referral to Gynecology   Urinary frequency       Relevant Orders   CBC   Urinalysis, Routine w reflex microscopic       Meds ordered this encounter  Medications   alendronate (FOSAMAX) 70 MG tablet    Sig: Take 1 tablet (70 mg total) by mouth every 7 (seven) days. Take with a full glass of water on an empty stomach.    Dispense:  4 tablet    Refill:  11   The 10-year ASCVD risk score (Arnett DK, et al., 2019) is: 8.5%   Values used to calculate the score:     Age: 67  years     Sex: Female     Is Non-Hispanic African American: No     Diabetic: No     Tobacco smoker: No     Systolic Blood Pressure: 123456 mmHg     Is BP treated: No     HDL Cholesterol: 97.3 mg/dL     Total Cholesterol: 218 mg/dL   Follow-up: Return in about 6 months (around 09/28/2021), or if symptoms worsen or fail to improve.  With shared decision making we decided to have her start Fosamax weekly.  She will continue calcium 1200 mg and vitamin D 800 international units daily.  Recheck bone densitometry in 3 years.  Requests referral for GYN care.  With her elevated Ht L cholesterol and intermittent risk for vascular disease we decided for her not to start a statin.  Information was given about osteoporosis and Fosamax.  Libby Maw, MD

## 2021-04-01 ENCOUNTER — Other Ambulatory Visit (INDEPENDENT_AMBULATORY_CARE_PROVIDER_SITE_OTHER): Payer: Medicare Other

## 2021-04-01 DIAGNOSIS — R350 Frequency of micturition: Secondary | ICD-10-CM

## 2021-04-01 NOTE — Addendum Note (Signed)
Addended by: Lynnea Ferrier on: 04/01/2021 04:06 PM   Modules accepted: Orders

## 2021-04-01 NOTE — Addendum Note (Signed)
Addended by: Russell Quinney M on: 04/01/2021 04:06 PM ° ° Modules accepted: Orders ° °

## 2021-04-02 LAB — URINALYSIS, ROUTINE W REFLEX MICROSCOPIC
Bilirubin Urine: NEGATIVE
Glucose, UA: NEGATIVE
Hgb urine dipstick: NEGATIVE
Ketones, ur: NEGATIVE
Leukocytes,Ua: NEGATIVE
Nitrite: NEGATIVE
Protein, ur: NEGATIVE
Specific Gravity, Urine: 1.014 (ref 1.001–1.035)
pH: 7 (ref 5.0–8.0)

## 2021-04-03 ENCOUNTER — Encounter: Payer: Self-pay | Admitting: Family Medicine

## 2021-04-04 ENCOUNTER — Telehealth: Payer: Self-pay

## 2021-04-04 NOTE — Telephone Encounter (Signed)
Per Dr.Kremer, continuation of medication has already been discussed.

## 2021-04-28 ENCOUNTER — Encounter: Payer: Self-pay | Admitting: Family Medicine

## 2021-06-20 ENCOUNTER — Telehealth: Payer: Self-pay | Admitting: Family Medicine

## 2021-06-20 ENCOUNTER — Encounter: Payer: Self-pay | Admitting: Family Medicine

## 2021-06-20 NOTE — Telephone Encounter (Signed)
Pt called about bill received from 03/31/2021, It shows her insurance didn't cover anything and she has Medicare and BCBS, She said she wanted to talk to someone here to see how it was coded. I advised her to call her insurance and billing department but she said she would rather talk to someone here. Call back is [phone]. Im wondering if the insurance is in the right order or if that would affect it from being covered ?

## 2021-06-21 NOTE — Telephone Encounter (Signed)
I had a message from [place] that I had reviewed by coding team. I have responded to pt today via MyChart msg. ?This is being refiled with updated DX and coding. ?

## 2021-09-05 ENCOUNTER — Other Ambulatory Visit: Payer: Self-pay | Admitting: Obstetrics and Gynecology

## 2021-09-05 DIAGNOSIS — R928 Other abnormal and inconclusive findings on diagnostic imaging of breast: Secondary | ICD-10-CM

## 2021-09-08 ENCOUNTER — Ambulatory Visit: Payer: Medicare Other

## 2021-09-08 ENCOUNTER — Ambulatory Visit
Admission: RE | Admit: 2021-09-08 | Discharge: 2021-09-08 | Disposition: A | Discharge status: Home / Self Care | Payer: Medicare Other | Source: Ambulatory Visit | Attending: Obstetrics and Gynecology | Admitting: Obstetrics and Gynecology

## 2021-09-08 DIAGNOSIS — R928 Other abnormal and inconclusive findings on diagnostic imaging of breast: Secondary | ICD-10-CM

## 2022-02-07 ENCOUNTER — Ambulatory Visit (INDEPENDENT_AMBULATORY_CARE_PROVIDER_SITE_OTHER): Payer: Medicare Other

## 2022-02-07 VITALS — Ht 65.5 in | Wt 127.0 lb

## 2022-02-07 DIAGNOSIS — Z0000 Encounter for general adult medical examination without abnormal findings: Secondary | ICD-10-CM | POA: Diagnosis not present

## 2022-02-07 NOTE — Progress Notes (Signed)
I connected with Joanna Rowe today by telephone and verified that I am speaking with the correct person using two identifiers. Location patient: home Location provider: work Persons participating in the virtual visit: [name], Lares LPN.   I discussed the limitations, risks, security and privacy concerns of performing an evaluation and management service by telephone and the availability of in person appointments. I also discussed with the patient that there may be a patient responsible charge related to this service. The patient expressed understanding and verbally consented to this telephonic visit.    Interactive audio and video telecommunications were attempted between this provider and patient, however failed, due to patient having technical difficulties OR patient did not have access to video capability.  We continued and completed visit with audio only.     Vital signs may be patient reported or missing.  Subjective:   Joanna Rowe is a 71 y.o. female who presents for Medicare Annual (Subsequent) preventive examination.  Review of Systems     Cardiac Risk Factors include: advanced age (>65men, >25 women)     Objective:    Today's Vitals   02/07/22 1256  Weight: 127 lb (57.6 kg)  Height: 5' 5.5" (1.664 m)   Body mass index is 20.81 kg/m.     02/07/2022    1:01 PM 02/02/2021   12:55 PM  Advanced Directives  Does Patient Have a Medical Advance Directive? Yes Yes  Type of Estate agent of [place];Living will Healthcare Power of [place];Living will  Copy of Healthcare Power of Attorney in Chart? No - copy requested No - copy requested    Current Medications (verified) Outpatient Encounter Medications as of 02/07/2022  Medication Sig   calcium citrate-vitamin D (CITRACAL+D) 315-200 MG-UNIT tablet Take 1 tablet by mouth 2 (two) times daily.   cholecalciferol (VITAMIN D3) 25 MCG (1000 UNIT) tablet Take 1,000 Units by mouth  daily.   Multiple Vitamin (MULTIVITAMIN) capsule Take 1 capsule by mouth daily.   RESTASIS 0.05 % ophthalmic emulsion 1 drop 2 (two) times daily.   alendronate (FOSAMAX) 70 MG tablet Take 1 tablet (70 mg total) by mouth every 7 (seven) days. Take with a full glass of water on an empty stomach. (Patient not taking: Reported on 02/07/2022)   No facility-administered encounter medications on file as of 02/07/2022.    Allergies (verified) Patient has no known allergies.   History: Past Medical History:  Diagnosis Date   Allergy    Cataract    PVC's (premature ventricular contractions)    Thyroid disease    Thyroid nodule    Past Surgical History:  Procedure Laterality Date   BIOPSY BREAST     BREAST EXCISIONAL BIOPSY Left    CATARACT EXTRACTION Bilateral    09/2020   TONSILLECTOMY AND ADENOIDECTOMY     Family History  Problem Relation Age of Onset   Diabetes Mother    Heart disease Father    Breast cancer Maternal Aunt    Colon cancer Neg Hx    Colon polyps Neg Hx    Esophageal cancer Neg Hx    Stomach cancer Neg Hx    Rectal cancer Neg Hx    Social History   Socioeconomic History   Marital status: Widowed    Spouse name: Not on file   Number of children: Not on file   Years of education: Not on file   Highest education level: Not on file  Occupational History   Not on file  Tobacco Use  Smoking status: Never   Smokeless tobacco: Never  Vaping Use   Vaping Use: Never used  Substance and Sexual Activity   Alcohol use: Yes    Comment: 1 glass a week    Drug use: Never   Sexual activity: Not on file  Other Topics Concern   Not on file  Social History Narrative   Not on file   Social Determinants of Health   Financial Resource Strain: Low Risk  (02/07/2022)   Overall Financial Resource Strain (CARDIA)    Difficulty of Paying Living Expenses: Not hard at all  Food Insecurity: No Food Insecurity (02/07/2022)   Hunger Vital Sign    Worried About Running Out  of Food in the Last Year: Never true    Ran Out of Food in the Last Year: Never true  Transportation Needs: No Transportation Needs (02/07/2022)   PRAPARE - Administrator, Civil Service (Medical): No    Lack of Transportation (Non-Medical): No  Physical Activity: Sufficiently Active (02/07/2022)   Exercise Vital Sign    Days of Exercise per Week: 7 days    Minutes of Exercise per Session: 120 min  Stress: No Stress Concern Present (02/07/2022)   [employer] of Occupational Health - Occupational Stress Questionnaire    Feeling of Stress : Not at all  Social Connections: Moderately Isolated (02/02/2021)   Social Connection and Isolation Panel [NHANES]    Frequency of Communication with Friends and Family: Twice a week    Frequency of Social Gatherings with Friends and Family: Twice a week    Attends Religious Services: Never    Database administrator or Organizations: Yes    Attends Banker Meetings: 1 to 4 times per year    Marital Status: Widowed    Tobacco Counseling Counseling given: Not Answered   Clinical Intake:  Pre-visit preparation completed: Yes  Pain : No/denies pain     Nutritional Status: BMI of 19-24  Normal Nutritional Risks: None Diabetes: No  How often do you need to have someone help you when you read instructions, pamphlets, or other written materials from your doctor or pharmacy?: 1 - Never  Diabetic? no  Interpreter Needed?: No  Information entered by :: NAllen LPN   Activities of Daily Living    02/07/2022    1:02 PM 02/06/2022    2:55 PM  In your present state of health, do you have any difficulty performing the following activities:  Hearing? 0 0  Comment has hearing aid   Vision? 0 0  Difficulty concentrating or making decisions? 0 0  Walking or climbing stairs? 0 0  Dressing or bathing? 0 0  Doing errands, shopping? 0 0  Preparing Food and eating ? N N  Using the Toilet? N N  In the past six months,  have you accidently leaked urine? N N  Do you have problems with loss of bowel control? N N  Managing your Medications? N N  Managing your Finances? N N  Housekeeping or managing your Housekeeping? N N    Patient Care Team: Mliss Sax, MD as PCP - General (Family Medicine)  Indicate any recent Medical Services you may have received from other than [facility] providers in the past year (date may be approximate).     Assessment:   This is a routine wellness examination for Joanna Rowe.  Hearing/Vision screen Vision Screening - Comments:: Regular eye exams, [facility]  Dietary issues and exercise activities discussed: Current  Exercise Habits: Home exercise routine, Type of exercise: walking;calisthenics;strength training/weights, Time (Minutes): > 60, Frequency (Times/Week): 7, Weekly Exercise (Minutes/Week): 0   Goals Addressed             This Visit's Progress    Patient Stated       02/07/2022, no goals       Depression Screen    02/07/2022    1:02 PM 03/31/2021    9:00 AM 02/02/2021   12:56 PM 02/02/2021   12:53 PM 08/24/2020    1:04 PM 07/26/2020    2:49 PM 07/26/2020    1:32 PM  PHQ 2/9 Scores  PHQ - 2 Score 0 0 0 0 0 0 0  PHQ- 9 Score      0     Fall Risk    02/07/2022    1:02 PM 02/06/2022    2:55 PM 03/31/2021    9:01 AM 02/02/2021   12:56 PM 08/24/2020    1:04 PM  Fall Risk   Falls in the past year? 0 0 0 0 0  Number falls in past yr: 0 0  0   Injury with Fall? 0 0  0   Risk for fall due to : No Fall Risks      Follow up Falls prevention discussed;Education provided;Falls evaluation completed   Falls evaluation completed     FALL RISK PREVENTION PERTAINING TO THE HOME:  Any stairs in or around the home? Yes  If so, are there any without handrails? Yes  Home free of loose throw rugs in walkways, pet beds, electrical cords, etc? Yes  Adequate lighting in your home to reduce risk of falls? Yes   ASSISTIVE DEVICES UTILIZED TO PREVENT  FALLS:  Life alert? No  Use of a cane, walker or w/c? No  Grab bars in the bathroom? No  Shower chair or bench in shower? Yes  Elevated toilet seat or a handicapped toilet? Yes   TIMED UP AND GO:  Was the test performed? No .       Cognitive Function:        02/07/2022    1:03 PM  6CIT Screen  What Year? 0 points  What month? 0 points  What time? 0 points  Count back from 20 0 points  Months in reverse 0 points  Repeat phrase 0 points  Total Score 0 points    Immunizations Immunization History  Administered Date(s) Administered   Influenza-Unspecified 01/18/2015, 01/03/2016, 12/28/2016, 01/01/2018, 11/18/2018, 12/23/2019, 12/31/2020   PFIZER(Purple Top)SARS-COV-2 Vaccination 05/17/2019, 06/11/2019, 07/23/2020, 12/11/2020   Pneumococcal Conjugate-13 01/03/2016   Pneumococcal Polysaccharide-23 12/28/2016   Tdap 06/26/2016   Zoster Recombinat (Shingrix) 11/18/2018, 01/31/2019   Zoster, Live 07/26/2015    TDAP status: Up to date  Flu Vaccine status: Up to date  Pneumococcal vaccine status: Up to date  Covid-19 vaccine status: Completed vaccines  Qualifies for Shingles Vaccine? Yes   Zostavax completed Yes   Shingrix Completed?: Yes  Screening Tests Health Maintenance  Topic Date Due   Hepatitis C Screening  Never done   MAMMOGRAM  08/02/2021   INFLUENZA VACCINE  10/25/2021   Medicare Annual Wellness (AWV)  02/02/2022   COLONOSCOPY (Pts 45-52yrs Insurance coverage will need to be confirmed)  12/30/2030   Pneumonia Vaccine 69+ Years old  Completed   DEXA SCAN  Completed   Zoster Vaccines- Shingrix  Completed   HPV VACCINES  Aged Out   COVID-19 Vaccine  Discontinued    Health Maintenance  Health  Maintenance Due  Topic Date Due   Hepatitis C Screening  Never done   MAMMOGRAM  08/02/2021   INFLUENZA VACCINE  10/25/2021   Medicare Annual Wellness (AWV)  02/02/2022    Colorectal cancer screening: No longer required.   Mammogram status: Completed  08/2021. Repeat every year  Bone Density status: Completed 08/02/2020.   Lung Cancer Screening: (Low Dose CT Chest recommended if Age 30-80 years, 30 pack-year currently smoking OR have quit w/in 15years.) does not qualify.   Lung Cancer Screening Referral: no  Additional Screening:  Hepatitis C Screening: does qualify;   Vision Screening: Recommended annual ophthalmology exams for early detection of glaucoma and other disorders of the eye. Is the patient up to date with their annual eye exam?  Yes  Who is the provider or what is the name of the office in which the patient attends annual eye exams? [facility] If pt is not established with a provider, would they like to be referred to a provider to establish care? No .   Dental Screening: Recommended annual dental exams for proper oral hygiene  Community Resource Referral / Chronic Care Management: CRR required this visit?  No   CCM required this visit?  No      Plan:     I have personally reviewed and noted the following in the patient's chart:   Medical and social history Use of alcohol, tobacco or illicit drugs  Current medications and supplements including opioid prescriptions. Patient is not currently taking opioid prescriptions. Functional ability and status Nutritional status Physical activity Advanced directives List of other physicians Hospitalizations, surgeries, and ER visits in previous 12 months Vitals Screenings to include cognitive, depression, and falls Referrals and appointments  In addition, I have reviewed and discussed with patient certain preventive protocols, quality metrics, and best practice recommendations. A written personalized care plan for preventive services as well as general preventive health recommendations were provided to patient.     Barb Merinoickeah E Ovella [place], LPN   [id number]/2023   Nurse Notes: none  Due to this being a virtual visit, the after visit summary with patients personalized  plan was offered to patient via mail or my-chart.  Patient would like to access on my-chart

## 2022-02-07 NOTE — Patient Instructions (Signed)
Joanna Rowe , Thank you for taking time to come for your Medicare Wellness Visit. I appreciate your ongoing commitment to your health goals. Please review the following plan we discussed and let me know if I can assist you in the future.   Screening recommendations/referrals: Colonoscopy: not required Mammogram: completed 08/2021 Bone Density: completed 08/02/2020 Recommended yearly ophthalmology/optometry visit for glaucoma screening and checkup Recommended yearly dental visit for hygiene and checkup  Vaccinations: Influenza vaccine: completed Pneumococcal vaccine: completed 12/28/2016 Tdap vaccine: completed 06/26/2016, due 06/27/2026 Shingles vaccine: completed   Covid-19: completed  Advanced directives: Please bring a copy of your POA (Power of [place]) and/or Living Will to your next appointment.   Conditions/risks identified: none  Next appointment: Follow up in one year for your annual wellness visit    Preventive Care 65 Years and Older, Female Preventive care refers to lifestyle choices and visits with your health care provider that can promote health and wellness. What does preventive care include? A yearly physical exam. This is also called an annual well check. Dental exams once or twice a year. Routine eye exams. Ask your health care provider how often you should have your eyes checked. Personal lifestyle choices, including: Daily care of your teeth and gums. Regular physical activity. Eating a healthy diet. Avoiding tobacco and drug use. Limiting alcohol use. Practicing safe sex. Taking low-dose aspirin every day. Taking vitamin and mineral supplements as recommended by your health care provider. What happens during an annual well check? The services and screenings done by your health care provider during your annual well check will depend on your age, overall health, lifestyle risk factors, and family history of disease. Counseling  Your health care provider may  ask you questions about your: Alcohol use. Tobacco use. Drug use. Emotional well-being. Home and relationship well-being. Sexual activity. Eating habits. History of falls. Memory and ability to understand (cognition). Work and work Astronomer. Reproductive health. Screening  You may have the following tests or measurements: Height, weight, and BMI. Blood pressure. Lipid and cholesterol levels. These may be checked every 5 years, or more frequently if you are over 64 years old. Skin check. Lung cancer screening. You may have this screening every year starting at age 21 if you have a 30-pack-year history of smoking and currently smoke or have quit within the past 15 years. Fecal occult blood test (FOBT) of the stool. You may have this test every year starting at age 23. Flexible sigmoidoscopy or colonoscopy. You may have a sigmoidoscopy every 5 years or a colonoscopy every 10 years starting at age 61. Hepatitis C blood test. Hepatitis B blood test. Sexually transmitted disease (STD) testing. Diabetes screening. This is done by checking your blood sugar (glucose) after you have not eaten for a while (fasting). You may have this done every 1-3 years. Bone density scan. This is done to screen for osteoporosis. You may have this done starting at age 69. Mammogram. This may be done every 1-2 years. Talk to your health care provider about how often you should have regular mammograms. Talk with your health care provider about your test results, treatment options, and if necessary, the need for more tests. Vaccines  Your health care provider may recommend certain vaccines, such as: Influenza vaccine. This is recommended every year. Tetanus, diphtheria, and acellular pertussis (Tdap, Td) vaccine. You may need a Td booster every 10 years. Zoster vaccine. You may need this after age 19. Pneumococcal 13-valent conjugate (PCV13) vaccine. One dose is recommended after  age 20. Pneumococcal  polysaccharide (PPSV23) vaccine. One dose is recommended after age 7. Talk to your health care provider about which screenings and vaccines you need and how often you need them. This information is not intended to replace advice given to you by your health care provider. Make sure you discuss any questions you have with your health care provider. Document Released: 04/09/2015 Document Revised: 12/01/2015 Document Reviewed: 01/12/2015 Elsevier Interactive Patient Education  2017 [employer] Prevention in the Home Falls can cause injuries. They can happen to people of all ages. There are many things you can do to make your home safe and to help prevent falls. What can I do on the outside of my home? Regularly fix the edges of walkways and driveways and fix any cracks. Remove anything that might make you trip as you walk through a door, such as a raised step or threshold. Trim any bushes or trees on the path to your home. Use bright outdoor lighting. Clear any walking paths of anything that might make someone trip, such as rocks or tools. Regularly check to see if handrails are loose or broken. Make sure that both sides of any steps have handrails. Any raised decks and porches should have guardrails on the edges. Have any leaves, snow, or ice cleared regularly. Use sand or salt on walking paths during winter. Clean up any spills in your garage right away. This includes oil or grease spills. What can I do in the bathroom? Use night lights. Install grab bars by the toilet and in the tub and shower. Do not use towel bars as grab bars. Use non-skid mats or decals in the tub or shower. If you need to sit down in the shower, use a plastic, non-slip stool. Keep the floor dry. Clean up any water that spills on the floor as soon as it happens. Remove soap buildup in the tub or shower regularly. Attach bath mats securely with double-sided non-slip rug tape. Do not have throw rugs and other  things on the floor that can make you trip. What can I do in the bedroom? Use night lights. Make sure that you have a light by your bed that is easy to reach. Do not use any sheets or blankets that are too big for your bed. They should not hang down onto the floor. Have a firm chair that has side arms. You can use this for support while you get dressed. Do not have throw rugs and other things on the floor that can make you trip. What can I do in the kitchen? Clean up any spills right away. Avoid walking on wet floors. Keep items that you use a lot in easy-to-reach places. If you need to reach something above you, use a strong step stool that has a grab bar. Keep electrical cords out of the way. Do not use floor polish or wax that makes floors slippery. If you must use wax, use non-skid floor wax. Do not have throw rugs and other things on the floor that can make you trip. What can I do with my stairs? Do not leave any items on the stairs. Make sure that there are handrails on both sides of the stairs and use them. Fix handrails that are broken or loose. Make sure that handrails are as long as the stairways. Check any carpeting to make sure that it is firmly attached to the stairs. Fix any carpet that is loose or worn. Avoid having throw rugs  at the top or bottom of the stairs. If you do have throw rugs, attach them to the floor with carpet tape. Make sure that you have a light switch at the top of the stairs and the bottom of the stairs. If you do not have them, ask someone to add them for you. What else can I do to help prevent falls? Wear shoes that: Do not have high heels. Have rubber bottoms. Are comfortable and fit you well. Are closed at the toe. Do not wear sandals. If you use a stepladder: Make sure that it is fully opened. Do not climb a closed stepladder. Make sure that both sides of the stepladder are locked into place. Ask someone to hold it for you, if possible. Clearly  mark and make sure that you can see: Any grab bars or handrails. First and last steps. Where the edge of each step is. Use tools that help you move around (mobility aids) if they are needed. These include: Canes. Walkers. Scooters. Crutches. Turn on the lights when you go into a dark area. Replace any light bulbs as soon as they burn out. Set up your furniture so you have a clear path. Avoid moving your furniture around. If any of your floors are uneven, fix them. If there are any pets around you, be aware of where they are. Review your medicines with your doctor. Some medicines can make you feel dizzy. This can increase your chance of falling. Ask your doctor what other things that you can do to help prevent falls. This information is not intended to replace advice given to you by your health care provider. Make sure you discuss any questions you have with your health care provider. Document Released: 01/07/2009 Document Revised: 08/19/2015 Document Reviewed: 04/17/2014 Elsevier Interactive Patient Education  2017 [employer].

## 2022-06-22 IMAGING — MG MM DIGITAL SCREENING BILAT W/ TOMO AND CAD
8 series · 9 of 24 positions shown · non-contrast
Comparison: Previous exam(s).

CLINICAL DATA: Screening.

EXAM:
DIGITAL SCREENING BILATERAL MAMMOGRAM WITH TOMOSYNTHESIS AND CAD
TECHNIQUE: Bilateral screening digital craniocaudal and mediolateral oblique
mammograms were obtained. Bilateral screening digital breast
tomosynthesis was performed. The images were evaluated with
computer-aided detection.

[R MLO synth-2D]
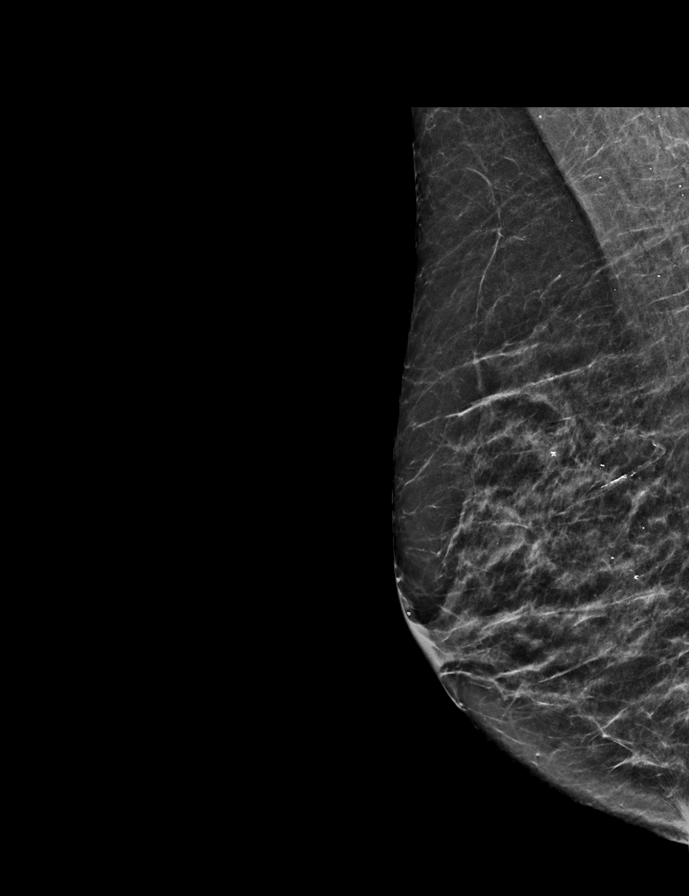

[L MLO synth-2D]
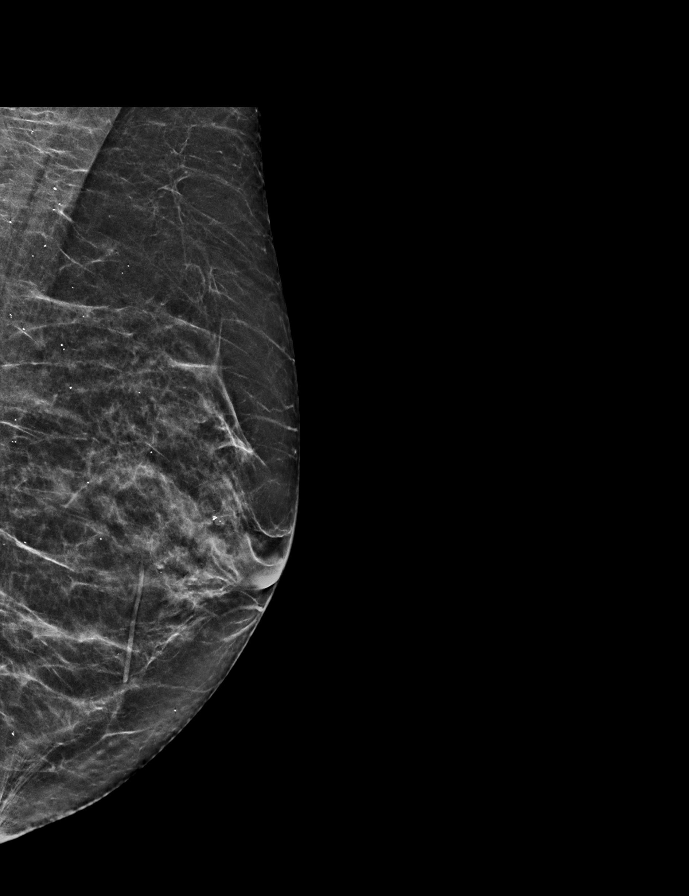

[R CC synth-2D]
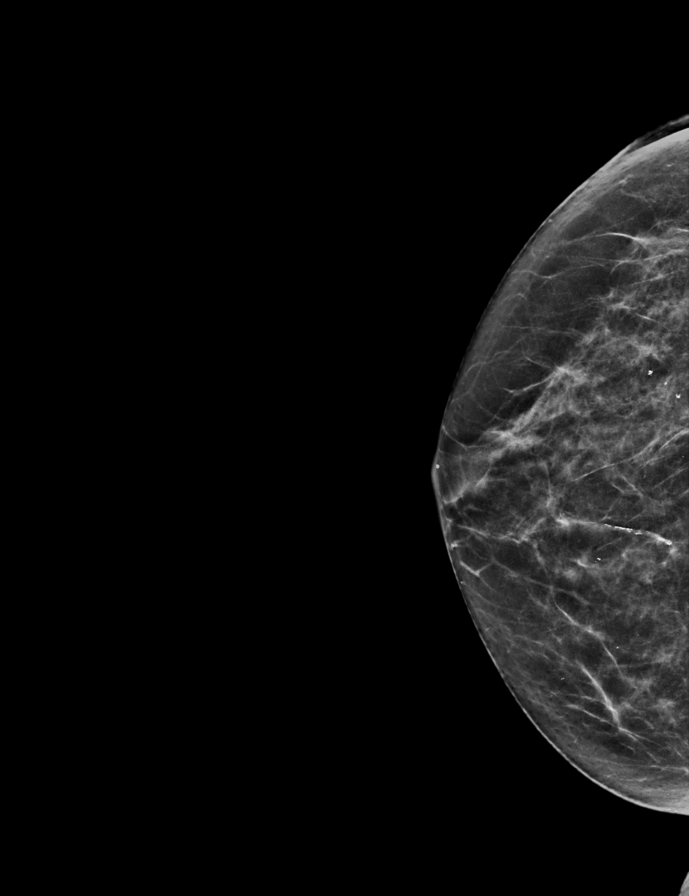

[L CC synth-2D]
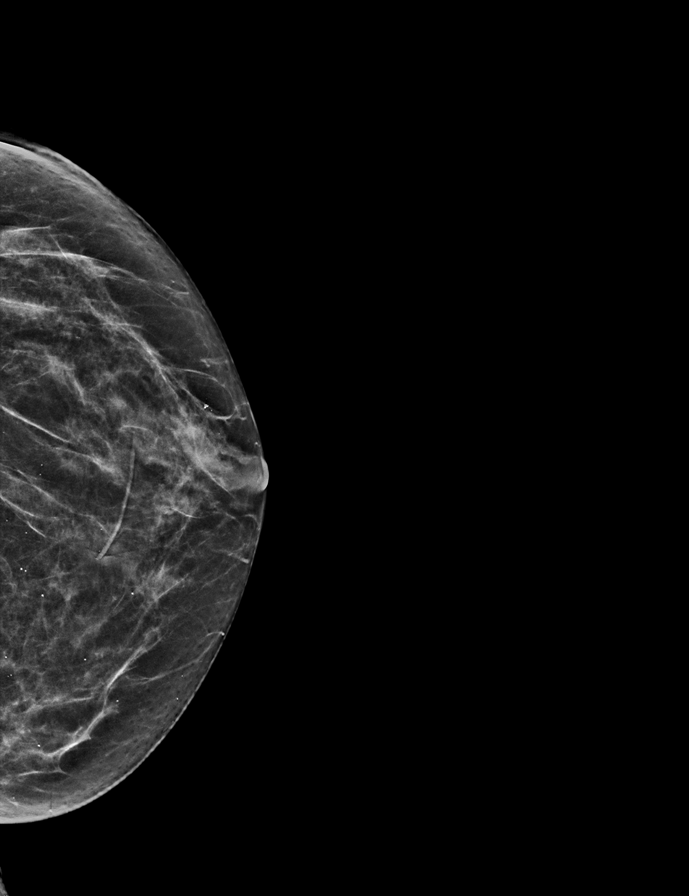

[R MLO tomo · 2 of 50 frames shown]
[frame 17/50]
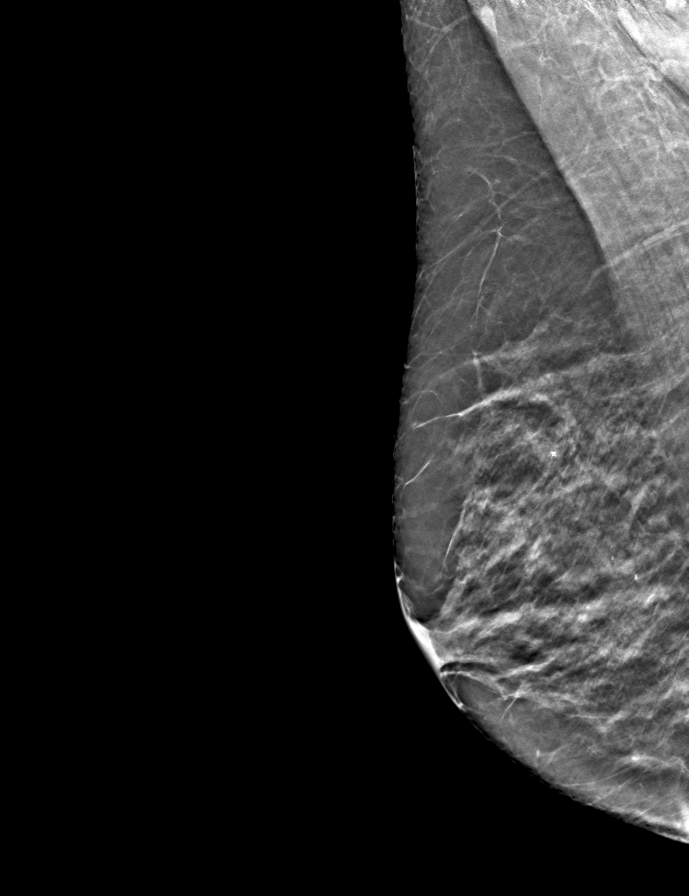
[frame 25/50]
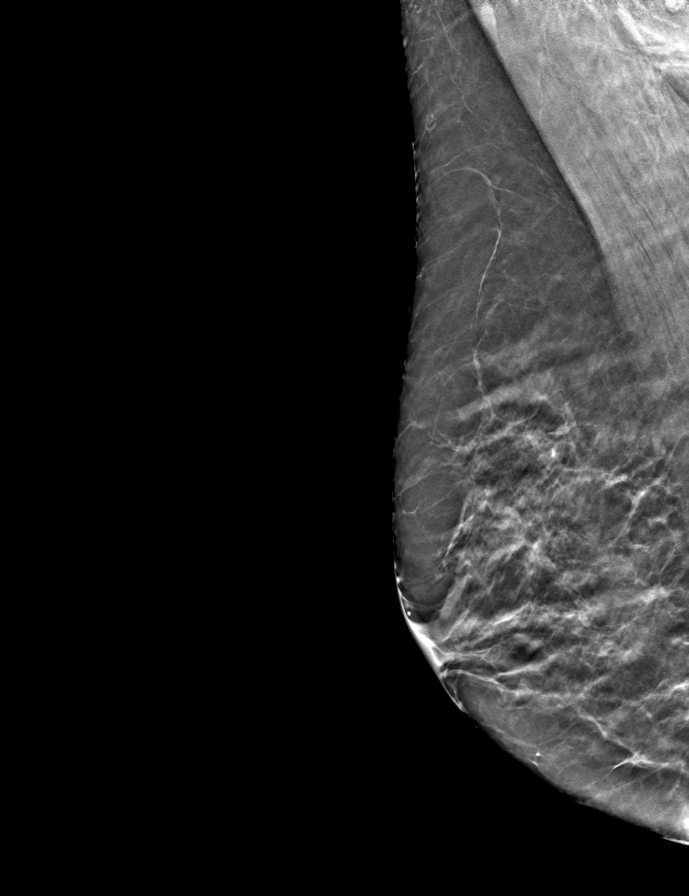

[L CC tomo · tomo slice 27/54.0]
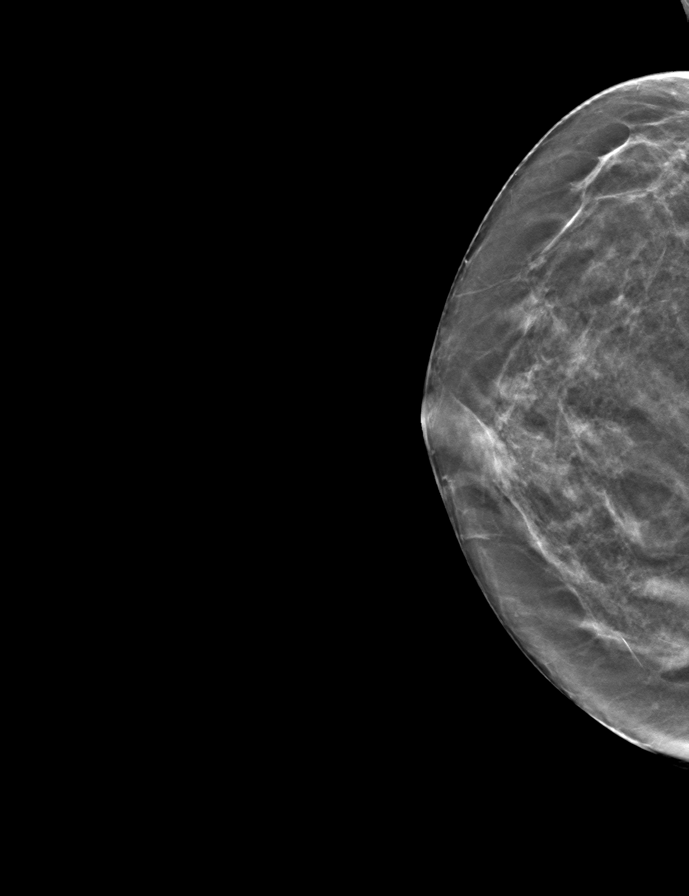

[L MLO tomo · tomo slice 25/50.0]
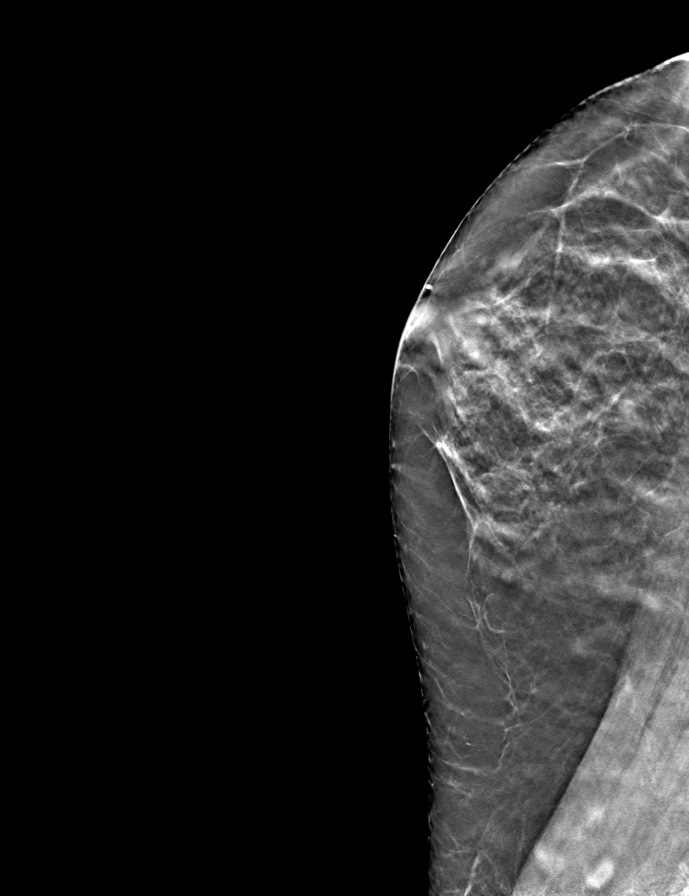

[R CC tomo · tomo slice 29/56.0]
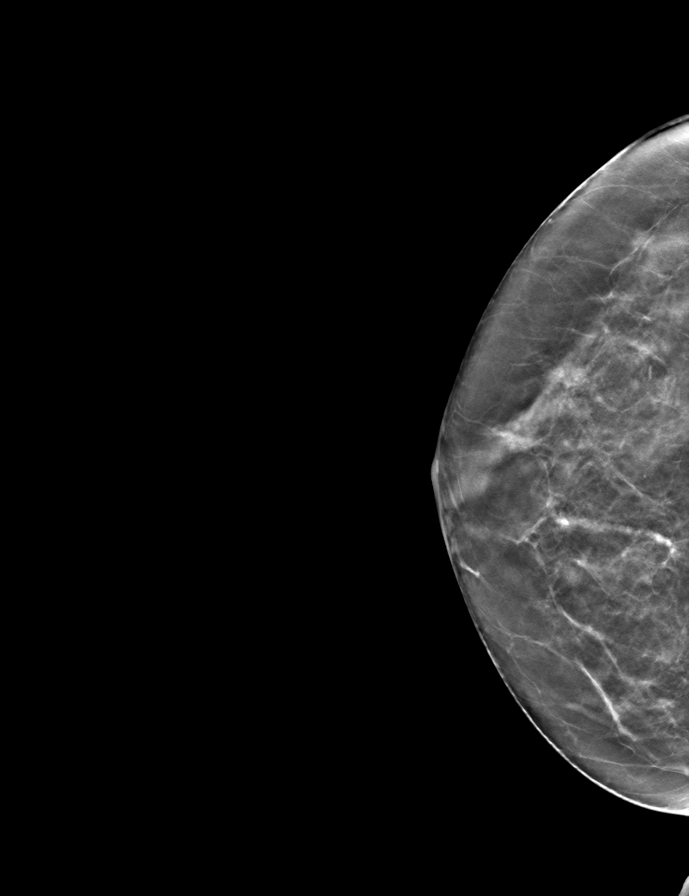

[9 of 24 positions shown; findings below may reference images not displayed]

ACR Breast Density Category b: There are scattered areas of
fibroglandular density.
FINDINGS: There are no findings suspicious for malignancy. The images were
evaluated with computer-aided detection.
IMPRESSION: No mammographic evidence of malignancy. A result letter of this
screening mammogram will be mailed directly to the patient.

RECOMMENDATION:
Screening mammogram in one year. (Code:[id number])

BI-RADS CATEGORY  1: Negative.

## 2022-07-12 LAB — HM PAP SMEAR: HPV, high-risk: NEGATIVE

## 2022-09-15 ENCOUNTER — Ambulatory Visit: Payer: Medicare Other | Admitting: Family Medicine

## 2022-10-12 LAB — HM MAMMOGRAPHY

## 2023-01-02 LAB — HM DIABETES EYE EXAM

## 2023-01-03 ENCOUNTER — Encounter: Payer: Self-pay | Admitting: Family Medicine

## 2023-02-12 ENCOUNTER — Ambulatory Visit (INDEPENDENT_AMBULATORY_CARE_PROVIDER_SITE_OTHER): Payer: Medicare Other

## 2023-02-12 DIAGNOSIS — Z0000 Encounter for general adult medical examination without abnormal findings: Secondary | ICD-10-CM

## 2023-02-12 NOTE — Patient Instructions (Signed)
Joanna Rowe , Thank you for taking time to come for your Medicare Wellness Visit. I appreciate your ongoing commitment to your health goals. Please review the following plan we discussed and let me know if I can assist you in the future.   Referrals/Orders/Follow-Ups/Clinician Recommendations: none  This is a list of the screening recommended for you and due dates:  Health Maintenance  Topic Date Due   Hepatitis C Screening  Never done   Mammogram  08/02/2021   Flu Shot  10/26/2022   Medicare Annual Wellness Visit  02/12/2024   DTaP/Tdap/Td vaccine (2 - Td or Tdap) 06/27/2026   Colon Cancer Screening  12/30/2030   Pneumonia Vaccine  Completed   DEXA scan (bone density measurement)  Completed   Zoster (Shingles) Vaccine  Completed   HPV Vaccine  Aged Out   COVID-19 Vaccine  Discontinued    Advanced directives: (Copy Requested) Please bring a copy of your health care power of attorney and living will to the office to be added to your chart at your convenience.  Next Medicare Annual Wellness Visit scheduled for next year: No, will schedule next year  Insert Preventive Care attachment Insert FALL PREVENTION attachment if needed

## 2023-02-12 NOTE — Progress Notes (Signed)
Subjective:   Joanna Rowe is a 72 y.o. female who presents for Medicare Annual (Subsequent) preventive examination.  Visit Complete: Virtual I connected with  Joanna Rowe on 02/12/23 by a audio enabled telemedicine application and verified that I am speaking with the correct person using two identifiers.  Patient Location: Home  Provider Location: Office/Clinic  I discussed the limitations of evaluation and management by telemedicine. The patient expressed understanding and agreed to proceed.  Vital Signs: Because this visit was a virtual/telehealth visit, some criteria may be missing or patient reported. Any vitals not documented were not able to be obtained and vitals that have been documented are patient reported.  Patient Medicare AWV questionnaire was completed by the patient on 02/09/2023; I have confirmed that all information answered by patient is correct and no changes since this date.  Cardiac Risk Factors include: advanced age (>60men, >56 women)     Objective:    Today's Vitals   There is no height or weight on file to calculate BMI.     02/12/2023    3:14 PM 02/07/2022    1:01 PM 02/02/2021   12:55 PM  Advanced Directives  Does Patient Have a Medical Advance Directive? Yes Yes Yes  Type of Estate agent of [place];Living will Healthcare Power of [place];Living will Healthcare Power of [place];Living will  Copy of Healthcare Power of Attorney in Chart? No - copy requested No - copy requested No - copy requested    Current Medications (verified) Outpatient Encounter Medications as of 02/12/2023  Medication Sig   calcium citrate-vitamin D (CITRACAL+D) 315-200 MG-UNIT tablet Take 1 tablet by mouth 2 (two) times daily.   cholecalciferol (VITAMIN D3) 25 MCG (1000 UNIT) tablet Take 1,000 Units by mouth daily.   Multiple Vitamin (MULTIVITAMIN) capsule Take 1 capsule by mouth daily.   RESTASIS 0.05 % ophthalmic emulsion 1 drop 2 (two)  times daily.   alendronate (FOSAMAX) 70 MG tablet Take 1 tablet (70 mg total) by mouth every 7 (seven) days. Take with a full glass of water on an empty stomach. (Patient not taking: Reported on 02/07/2022)   No facility-administered encounter medications on file as of 02/12/2023.    Allergies (verified) Patient has no known allergies.   History: Past Medical History:  Diagnosis Date   Allergy    Cataract    PVC's (premature ventricular contractions)    Thyroid disease    Thyroid nodule    Past Surgical History:  Procedure Laterality Date   BIOPSY BREAST     BREAST EXCISIONAL BIOPSY Left    CATARACT EXTRACTION Bilateral    09/2020   TONSILLECTOMY AND ADENOIDECTOMY     Family History  Problem Relation Age of Onset   Diabetes Mother    Heart disease Father    Breast cancer Maternal Aunt    Colon cancer Neg Hx    Colon polyps Neg Hx    Esophageal cancer Neg Hx    Stomach cancer Neg Hx    Rectal cancer Neg Hx    Social History   Socioeconomic History   Marital status: Widowed    Spouse name: Not on file   Number of children: Not on file   Years of education: Not on file   Highest education level: Not on file  Occupational History   Not on file  Tobacco Use   Smoking status: Never   Smokeless tobacco: Never  Vaping Use   Vaping status: Never Used  Substance and Sexual Activity  Alcohol use: Yes    Comment: 1 glass a week    Drug use: Never   Sexual activity: Not on file  Other Topics Concern   Not on file  Social History Narrative   Not on file   Social Determinants of Health   Financial Resource Strain: Low Risk  (02/09/2023)   Overall Financial Resource Strain (CARDIA)    Difficulty of Paying Living Expenses: Not hard at all  Food Insecurity: No Food Insecurity (02/09/2023)   Hunger Vital Sign    Worried About Running Out of Food in the Last Year: Never true    Ran Out of Food in the Last Year: Never true  Transportation Needs: No Transportation  Needs (02/09/2023)   PRAPARE - Administrator, Civil Service (Medical): No    Lack of Transportation (Non-Medical): No  Physical Activity: Sufficiently Active (02/09/2023)   Exercise Vital Sign    Days of Exercise per Week: 7 days    Minutes of Exercise per Session: 120 min  Stress: No Stress Concern Present (02/09/2023)   [employer] of Occupational Health - Occupational Stress Questionnaire    Feeling of Stress : Only a little  Social Connections: Unknown (02/09/2023)   Social Connection and Isolation Panel [NHANES]    Frequency of Communication with Friends and Family: Three times a week    Frequency of Social Gatherings with Friends and Family: Twice a week    Attends Religious Services: Not on Marketing executive or Organizations: Yes    Attends Banker Meetings: More than 4 times per year    Marital Status: Widowed    Tobacco Counseling Counseling given: Not Answered   Clinical Intake:  Pre-visit preparation completed: Yes  Pain : No/denies pain     Nutritional Risks: None Diabetes: No  How often do you need to have someone help you when you read instructions, pamphlets, or other written materials from your doctor or pharmacy?: 1 - Never  Interpreter Needed?: No  Information entered by :: NAllen LPN   Activities of Daily Living    02/09/2023   11:12 AM  In your present state of health, do you have any difficulty performing the following activities:  Hearing? 0  Vision? 0  Difficulty concentrating or making decisions? 0  Walking or climbing stairs? 0  Dressing or bathing? 0  Doing errands, shopping? 0  Preparing Food and eating ? N  Using the Toilet? N  In the past six months, have you accidently leaked urine? N  Do you have problems with loss of bowel control? N  Managing your Medications? N  Managing your Finances? N  Housekeeping or managing your Housekeeping? N    Patient Care Team: Mliss Sax, MD as PCP - General (Family Medicine)  Indicate any recent Medical Services you may have received from other than [facility] providers in the past year (date may be approximate).     Assessment:   This is a routine wellness examination for Joanna Rowe.  Hearing/Vision screen Hearing Screening - Comments:: Denies hearing issues Vision Screening - Comments:: Regular eye exams, [facility]   Goals Addressed             This Visit's Progress    Patient Stated       02/12/2023, denies goals       Depression Screen    02/12/2023    3:14 PM 02/07/2022    1:02 PM 03/31/2021  9:00 AM 02/02/2021   12:56 PM 02/02/2021   12:53 PM 08/24/2020    1:04 PM 07/26/2020    2:49 PM  PHQ 2/9 Scores  PHQ - 2 Score 0 0 0 0 0 0 0  PHQ- 9 Score 0      0    Fall Risk    02/09/2023   11:12 AM 02/07/2022    1:02 PM 02/06/2022    2:55 PM 03/31/2021    9:01 AM 02/02/2021   12:56 PM  Fall Risk   Falls in the past year? 0 0 0 0 0  Number falls in past yr: 0 0 0  0  Injury with Fall? 0 0 0  0  Risk for fall due to : No Fall Risks No Fall Risks     Follow up Falls prevention discussed;Falls evaluation completed Falls prevention discussed;Education provided;Falls evaluation completed   Falls evaluation completed    MEDICARE RISK AT HOME: Medicare Risk at Home Any stairs in or around the home?: Yes If so, are there any without handrails?: Yes Home free of loose throw rugs in walkways, pet beds, electrical cords, etc?: Yes Adequate lighting in your home to reduce risk of falls?: Yes Life alert?: No Use of a cane, walker or w/c?: No Grab bars in the bathroom?: No Shower chair or bench in shower?: Yes Elevated toilet seat or a handicapped toilet?: No  TIMED UP AND GO:  Was the test performed?  No    Cognitive Function:        02/12/2023    3:15 PM 02/07/2022    1:03 PM  6CIT Screen  What Year? 0 points 0 points  What month? 0 points 0 points  What time? 0 points 0 points  Count back  from 20 0 points 0 points  Months in reverse 0 points 0 points  Repeat phrase 0 points 0 points  Total Score 0 points 0 points    Immunizations Immunization History  Administered Date(s) Administered   Fluad Quad(high Dose 65+) 01/09/2022   Influenza-Unspecified 01/18/2015, 01/03/2016, 12/28/2016, 01/01/2018, 11/18/2018, 12/23/2019, 12/31/2020   PFIZER(Purple Top)SARS-COV-2 Vaccination 05/17/2019, 06/11/2019, 07/23/2020, 12/11/2020   Pneumococcal Conjugate-13 01/03/2016   Pneumococcal Polysaccharide-23 12/28/2016   Tdap 06/26/2016   Zoster Recombinant(Shingrix) 11/18/2018, 01/31/2019   Zoster, Live 07/26/2015    TDAP status: Up to date  Flu Vaccine status: Up to date  Pneumococcal vaccine status: Up to date  Covid-19 vaccine status: Completed vaccines  Qualifies for Shingles Vaccine? Yes   Zostavax completed Yes   Shingrix Completed?: Yes  Screening Tests Health Maintenance  Topic Date Due   Hepatitis C Screening  Never done   MAMMOGRAM  08/02/2021   INFLUENZA VACCINE  10/26/2022   Medicare Annual Wellness (AWV)  02/12/2024   DTaP/Tdap/Td (2 - Td or Tdap) 06/27/2026   Colonoscopy  12/30/2030   Pneumonia Vaccine 77+ Years old  Completed   DEXA SCAN  Completed   Zoster Vaccines- Shingrix  Completed   HPV VACCINES  Aged Out   COVID-19 Vaccine  Discontinued    Health Maintenance  Health Maintenance Due  Topic Date Due   Hepatitis C Screening  Never done   MAMMOGRAM  08/02/2021   INFLUENZA VACCINE  10/26/2022    Colorectal cancer screening: Type of screening: Colonoscopy. Completed 12/29/2020. Repeat every 10 years  Mammogram status: Completed 2024. Repeat every year  Bone Density status: Completed 2024.   Lung Cancer Screening: (Low Dose CT Chest recommended if Age 19-80 years, 43  pack-year currently smoking OR have quit w/in 15years.) does not qualify.   Lung Cancer Screening Referral: no  Additional Screening:  Hepatitis C Screening: does qualify;    Vision Screening: Recommended annual ophthalmology exams for early detection of glaucoma and other disorders of the eye. Is the patient up to date with their annual eye exam?  Yes  Who is the provider or what is the name of the office in which the patient attends annual eye exams? [facility] If pt is not established with a provider, would they like to be referred to a provider to establish care? No .   Dental Screening: Recommended annual dental exams for proper oral hygiene  Diabetic Foot Exam: n/a  Community Resource Referral / Chronic Care Management: CRR required this visit?  No   CCM required this visit?  No     Plan:     I have personally reviewed and noted the following in the patient's chart:   Medical and social history Use of alcohol, tobacco or illicit drugs  Current medications and supplements including opioid prescriptions. Patient is not currently taking opioid prescriptions. Functional ability and status Nutritional status Physical activity Advanced directives List of other physicians Hospitalizations, surgeries, and ER visits in previous 12 months Vitals Screenings to include cognitive, depression, and falls Referrals and appointments  In addition, I have reviewed and discussed with patient certain preventive protocols, quality metrics, and best practice recommendations. A written personalized care plan for preventive services as well as general preventive health recommendations were provided to patient.     Barb Merino, LPN   01/21/2535   After Visit Summary: (MyChart) Due to this being a telephonic visit, the after visit summary with patients personalized plan was offered to patient via MyChart   Nurse Notes: none

## 2023-02-13 ENCOUNTER — Encounter: Payer: Self-pay | Admitting: Family Medicine

## 2023-07-23 ENCOUNTER — Other Ambulatory Visit: Payer: Self-pay | Admitting: Obstetrics and Gynecology

## 2023-07-23 DIAGNOSIS — N6311 Unspecified lump in the right breast, upper outer quadrant: Secondary | ICD-10-CM

## 2023-08-02 ENCOUNTER — Ambulatory Visit
Admission: RE | Admit: 2023-08-02 | Discharge: 2023-08-02 | Disposition: A | Discharge status: Home / Self Care | Source: Ambulatory Visit | Attending: Obstetrics and Gynecology | Admitting: Obstetrics and Gynecology

## 2023-08-02 DIAGNOSIS — N6311 Unspecified lump in the right breast, upper outer quadrant: Secondary | ICD-10-CM

## 2023-08-31 ENCOUNTER — Telehealth: Payer: Self-pay | Admitting: Family Medicine

## 2023-08-31 NOTE — Telephone Encounter (Signed)
 error

## 2023-09-04 ENCOUNTER — Telehealth: Payer: Self-pay | Admitting: Family Medicine

## 2023-09-04 NOTE — Telephone Encounter (Signed)
 ERROR

## 2023-10-16 ENCOUNTER — Other Ambulatory Visit: Payer: Self-pay | Admitting: Obstetrics and Gynecology

## 2023-10-16 DIAGNOSIS — Z1231 Encounter for screening mammogram for malignant neoplasm of breast: Secondary | ICD-10-CM

## 2023-11-02 ENCOUNTER — Ambulatory Visit
Admission: RE | Admit: 2023-11-02 | Discharge: 2023-11-02 | Disposition: A | Discharge status: Home / Self Care | Source: Ambulatory Visit | Attending: Obstetrics and Gynecology | Admitting: Obstetrics and Gynecology

## 2023-11-02 DIAGNOSIS — Z1231 Encounter for screening mammogram for malignant neoplasm of breast: Secondary | ICD-10-CM
